# Patient Record
Sex: Male | Born: 1965 | Race: White | Hispanic: No | Marital: Married | Smoking: Current every day smoker
Health system: Southern US, Community
[De-identification: ages and names within clinical notes are randomized; demographics above are authoritative.]

## PROBLEM LIST (undated history)

## (undated) DIAGNOSIS — F319 Bipolar disorder, unspecified: Secondary | ICD-10-CM

## (undated) DIAGNOSIS — IMO0002 Reserved for concepts with insufficient information to code with codable children: Secondary | ICD-10-CM

## (undated) DIAGNOSIS — C7931 Secondary malignant neoplasm of brain: Secondary | ICD-10-CM

## (undated) DIAGNOSIS — C3490 Malignant neoplasm of unspecified part of unspecified bronchus or lung: Secondary | ICD-10-CM

## (undated) DIAGNOSIS — IMO0001 Reserved for inherently not codable concepts without codable children: Secondary | ICD-10-CM

## (undated) DIAGNOSIS — I10 Essential (primary) hypertension: Secondary | ICD-10-CM

## (undated) DIAGNOSIS — E119 Type 2 diabetes mellitus without complications: Secondary | ICD-10-CM

## (undated) DIAGNOSIS — J449 Chronic obstructive pulmonary disease, unspecified: Secondary | ICD-10-CM

## (undated) HISTORY — DX: Chronic obstructive pulmonary disease, unspecified: J44.9

## (undated) HISTORY — DX: Essential (primary) hypertension: I10

## (undated) HISTORY — DX: Type 2 diabetes mellitus without complications: E11.9

## (undated) HISTORY — DX: Malignant neoplasm of unspecified part of unspecified bronchus or lung: C34.90

## (undated) HISTORY — DX: Bipolar disorder, unspecified: F31.9

---

## 1999-09-16 ENCOUNTER — Emergency Department (HOSPITAL_COMMUNITY): Admission: EM | Admit: 1999-09-16 | Discharge: 1999-09-16 | Payer: Self-pay | Admitting: Emergency Medicine

## 1999-09-16 ENCOUNTER — Encounter: Payer: Self-pay | Admitting: Emergency Medicine

## 2000-10-03 ENCOUNTER — Encounter: Payer: Self-pay | Admitting: Emergency Medicine

## 2000-10-03 ENCOUNTER — Emergency Department (HOSPITAL_COMMUNITY): Admission: EM | Admit: 2000-10-03 | Discharge: 2000-10-03 | Payer: Self-pay | Admitting: Emergency Medicine

## 2002-04-06 ENCOUNTER — Emergency Department (HOSPITAL_COMMUNITY): Admission: EM | Admit: 2002-04-06 | Discharge: 2002-04-06 | Payer: Self-pay | Admitting: Emergency Medicine

## 2015-04-22 ENCOUNTER — Telehealth: Payer: Self-pay | Admitting: Internal Medicine

## 2015-04-22 NOTE — Telephone Encounter (Signed)
LEFT MESSAGE FOR PATIENT TO RETURN CALL TO SCHEDULE NP APPT.  °

## 2015-05-10 ENCOUNTER — Other Ambulatory Visit: Payer: Self-pay | Admitting: Internal Medicine

## 2015-05-11 ENCOUNTER — Encounter: Payer: Self-pay | Admitting: Internal Medicine

## 2015-05-11 ENCOUNTER — Telehealth: Payer: Self-pay | Admitting: Internal Medicine

## 2015-05-11 ENCOUNTER — Other Ambulatory Visit: Payer: Medicaid Other

## 2015-05-11 ENCOUNTER — Ambulatory Visit (HOSPITAL_BASED_OUTPATIENT_CLINIC_OR_DEPARTMENT_OTHER): Payer: Medicaid Other

## 2015-05-11 ENCOUNTER — Ambulatory Visit (HOSPITAL_BASED_OUTPATIENT_CLINIC_OR_DEPARTMENT_OTHER): Payer: Medicaid Other | Admitting: Internal Medicine

## 2015-05-11 ENCOUNTER — Ambulatory Visit: Payer: Medicaid Other

## 2015-05-11 VITALS — BP 121/75 | HR 84 | Temp 98.4°F | Resp 18 | Ht 73.0 in | Wt 215.9 lb

## 2015-05-11 DIAGNOSIS — F17210 Nicotine dependence, cigarettes, uncomplicated: Secondary | ICD-10-CM | POA: Diagnosis not present

## 2015-05-11 DIAGNOSIS — C3491 Malignant neoplasm of unspecified part of right bronchus or lung: Secondary | ICD-10-CM

## 2015-05-11 DIAGNOSIS — F1021 Alcohol dependence, in remission: Secondary | ICD-10-CM

## 2015-05-11 DIAGNOSIS — F1290 Cannabis use, unspecified, uncomplicated: Secondary | ICD-10-CM | POA: Diagnosis not present

## 2015-05-11 DIAGNOSIS — Z95828 Presence of other vascular implants and grafts: Secondary | ICD-10-CM

## 2015-05-11 LAB — COMPREHENSIVE METABOLIC PANEL (CC13)
ALK PHOS: 77 U/L (ref 40–150)
AST: 9 U/L (ref 5–34)
Albumin: 3.8 g/dL (ref 3.5–5.0)
Anion Gap: 12 mEq/L — ABNORMAL HIGH (ref 3–11)
BILIRUBIN TOTAL: 0.31 mg/dL (ref 0.20–1.20)
BUN: 11.3 mg/dL (ref 7.0–26.0)
CO2: 23 mEq/L (ref 22–29)
CREATININE: 1.4 mg/dL — AB (ref 0.7–1.3)
Calcium: 9.7 mg/dL (ref 8.4–10.4)
Chloride: 105 mEq/L (ref 98–109)
EGFR: 57 mL/min/{1.73_m2} — AB (ref >90)
Glucose: 88 mg/dl (ref 70–140)
Potassium: 3.5 mEq/L (ref 3.5–5.1)
SODIUM: 140 meq/L (ref 136–145)
TOTAL PROTEIN: 7 g/dL (ref 6.4–8.3)

## 2015-05-11 LAB — CBC WITH DIFFERENTIAL/PLATELET
BASO%: 0.7 % (ref 0.0–2.0)
BASOS ABS: 0 10*3/uL (ref 0.0–0.1)
EOS%: 6.9 % (ref 0.0–7.0)
Eosinophils Absolute: 0.4 10*3/uL (ref 0.0–0.5)
HCT: 32.9 % — ABNORMAL LOW (ref 38.4–49.9)
HGB: 11.3 g/dL — ABNORMAL LOW (ref 13.0–17.1)
LYMPH#: 1.2 10*3/uL (ref 0.9–3.3)
LYMPH%: 22 % (ref 14.0–49.0)
MCH: 33.3 pg (ref 27.2–33.4)
MCHC: 34.3 g/dL (ref 32.0–36.0)
MCV: 97.1 fL (ref 79.3–98.0)
MONO#: 0.4 10*3/uL (ref 0.1–0.9)
MONO%: 7.8 % (ref 0.0–14.0)
NEUT#: 3.4 10*3/uL (ref 1.5–6.5)
NEUT%: 62.6 % (ref 39.0–75.0)
Platelets: 151 10*3/uL (ref 140–400)
RBC: 3.39 10*6/uL — ABNORMAL LOW (ref 4.20–5.82)
RDW: 14 % (ref 11.0–14.6)
WBC: 5.5 10*3/uL (ref 4.0–10.3)
nRBC: 0 % (ref 0–0)

## 2015-05-11 MED ORDER — SODIUM CHLORIDE 0.9 % IJ SOLN
10.0000 mL | INTRAMUSCULAR | Status: DC | PRN
  Administered 2015-05-11: 10 mL via INTRAVENOUS
  Filled 2015-05-11: qty 10

## 2015-05-11 MED ORDER — HEPARIN SOD (PORK) LOCK FLUSH 100 UNIT/ML IV SOLN
500.0000 [IU] | Freq: Once | INTRAVENOUS | Status: AC
  Administered 2015-05-11: 500 [IU] via INTRAVENOUS
  Filled 2015-05-11: qty 5

## 2015-05-11 NOTE — Progress Notes (Signed)
Per Ct scan in January 2016 from [facility] tip of port a cath lies in superior vena cava.

## 2015-05-11 NOTE — Telephone Encounter (Signed)
per pof to sch pt appt-gave pt copy of avs °

## 2015-05-11 NOTE — Telephone Encounter (Signed)
per po fto sch pt appt-gave pt copy of avs-sent back to lab °

## 2015-05-11 NOTE — Progress Notes (Signed)
[place] [phone]   [phone]  CONSULT NOTE  REFERRING PHYSICIAN: Dr. Daphene Jaeger  REASON FOR CONSULTATION:  49 years old white male with history of lung cancer.  HPI Glen Mendez is a 49 y.o. male with past medical history significant for bipolar disorder, hypertension, diabetes mellitus, alcoholic cardiomyopathy, irregular heart rate, COPD as well as long history of smoking. The patient mentions that in October 2015 he presented to the emergency department at [facility] complaining of increasing dyspnea and chest x-ray at that time was concerning for pneumonia. The patient was admitted for intravenous antibiotics treatment. He was also found to have right-sided pleural effusion and underwent thoracentesis that showed no evidence of malignancy. CT scan of the chest at that time showed mass in the right upper lobe. The patient was transferred to the [facility] in [place] for consideration of surgical resection. A PET scan performed at that time showed the right upper lobe lung lesion in addition to right paratracheal nodes as well as subcarinal lymphadenopathy. MRI of the brain at that time was negative for metastatic disease. On 08/15/2014 the patient underwent bronchoscopy and the final pathology case number [id number] showed small cell undifferentiated carcinoma. The patient was seen by Dr. Daphene Jaeger. He underwent a course of concurrent chemoradiation with cisplatin and etoposide for total of 4 cycles completed 11/11/2014. The patient also underwent a prophylactic cranial irradiation completed on 01/03/2015. He tolerated his previous treatment fairly well with no significant adverse effects. His last imaging studies including CT scan of the chest, abdomen and pelvis performed on 03/14/2015 showed extensive pleural/parenchymal abnormality involving the right lung without significant change from the last  scan after completion of his treatment. The patient moved to [facility] to take care of his elderly parents.  Dr. Andreas Ohm kindly referred the patient to me today for evaluation and to establish care with me. When seen today the patient is feeling fine and denied having any significant complaints. He denied having any significant chest pain but continues to have mild shortness of breath with exertion as well as mild cough with no sputum or hemoptysis. He lost few pounds recently secondary to lack of appetite. He also has occasional headache in the morning. The patient denied having any significant nausea or vomiting. Family history: The patient is adopted. The patient is married and has one daughter age 25. He worked in several jobs but currently unemployed. He has a history of smoking up to 2 packs per day for around 35 years and unfortunately he continues to smoke around 5 cigarettes every day. He is also recovering alcoholic. He smokes marijuana occasionally.   HPI  Past Medical History  Diagnosis Date  . Lung cancer   . Bipolar disorder   . COPD (chronic obstructive pulmonary disease)   . Hypertension   . Diabetes mellitus without complication     History reviewed. No pertinent past surgical history.  Family History  Problem Relation Age of Onset  . Adopted: Yes    Social History History  Substance Use Topics  . Smoking status: Current Every Day Smoker -- 2.00 packs/day for 20 years    Types: Cigarettes  . Smokeless tobacco: Never Used     Comment: Currently smokes 5 cig. day  . Alcohol Use: No     Comment: recovering alcholic quit 2952,     Allergies  Allergen Reactions  . Vicodin [Hydrocodone-Acetaminophen]     " makes  me itch" i can take it if I take bendryl    Current Outpatient Prescriptions  Medication Sig Dispense Refill  . amitriptyline (ELAVIL) 25 MG tablet Take 25 mg by mouth at bedtime.    . carvedilol (COREG) 25 MG tablet Take 25 mg by mouth 2  (two) times daily with a meal.    . clonazePAM (KLONOPIN) 1 MG tablet Take 1 mg by mouth 3 (three) times daily as needed for anxiety.    Marland Kitchen lisinopril (PRINIVIL,ZESTRIL) 10 MG tablet Take 10 mg by mouth daily.    . metFORMIN (GLUCOPHAGE) 500 MG tablet Take 500 mg by mouth 2 (two) times daily with a meal.    . QUEtiapine (SEROQUEL) 400 MG tablet Take 400 mg by mouth at bedtime.    . ranitidine (ZANTAC) 150 MG tablet Take 150 mg by mouth 2 (two) times daily.     No current facility-administered medications for this visit.   Facility-Administered Medications Ordered in Other Visits  Medication Dose Route Frequency Provider Last Rate Last Dose  . sodium chloride 0.9 % injection 10 mL  10 mL Intravenous PRN Curt Bears, MD   10 mL at 05/11/15 1612    Review of Systems  Constitutional: positive for anorexia and weight loss Eyes: negative Ears, nose, mouth, throat, and face: negative Respiratory: positive for cough and dyspnea on exertion Cardiovascular: negative Gastrointestinal: negative Genitourinary:negative Integument/breast: negative Hematologic/lymphatic: negative Musculoskeletal:negative Neurological: negative Behavioral/Psych: negative Endocrine: negative Allergic/Immunologic: negative  Physical Exam  SNK:NLZJQ, healthy, no distress, well nourished and well developed SKIN: skin color, texture, turgor are normal, no rashes or significant lesions HEAD: Normocephalic, No masses, lesions, tenderness or abnormalities EYES: normal, PERRLA, Conjunctiva are pink and non-injected EARS: External ears normal, Canals clear OROPHARYNX:no exudate, no erythema and lips, buccal mucosa, and tongue normal  NECK: supple, no adenopathy, no JVD LYMPH:  no palpable lymphadenopathy, no hepatosplenomegaly LUNGS: clear to auscultation , and palpation HEART: regular rate & rhythm and no murmurs ABDOMEN:abdomen soft, non-tender, normal bowel sounds and no masses or organomegaly BACK: Back  symmetric, no curvature., No CVA tenderness EXTREMITIES:no joint deformities, effusion, or inflammation, no edema, no skin discoloration, no clubbing  NEURO: alert & oriented x 3 with fluent speech, no focal motor/sensory deficits  PERFORMANCE STATUS: ECOG 1  LABORATORY DATA: Lab Results  Component Value Date   WBC 5.5 05/11/2015   HGB 11.3* 05/11/2015   HCT 32.9* 05/11/2015   MCV 97.1 05/11/2015   PLT 151 05/11/2015      Chemistry      Component Value Date/Time   NA 140 05/11/2015 1609   K 3.5 05/11/2015 1609   CO2 23 05/11/2015 1609   BUN 11.3 05/11/2015 1609   CREATININE 1.4* 05/11/2015 1609      Component Value Date/Time   CALCIUM 9.7 05/11/2015 1609   ALKPHOS 77 05/11/2015 1609   AST 9 05/11/2015 1609   ALT <6 05/11/2015 1609   BILITOT 0.31 05/11/2015 1609       RADIOGRAPHIC STUDIES: No results found.  ASSESSMENT: This is a very pleasant 49 years old white male who was diagnosed with limited stage (T2a, N2, M0) small cell lung cancer in October 2015 status post course of concurrent chemoradiation with 4 cycles of cisplatin and etoposide followed by prophylactic cranial irradiation with significant improvement in his disease.   PLAN: I had a lengthy discussion with the patient today about his current disease status and treatment options. The patient completed his treatment under the care of Dr. Andreas Ohm  at [facility] oncology with good response.  He is currently on observation.  I recommended for the patient to have repeat CT scan of the chest performed next week as a baseline. If no clear evidence for disease progression, I would see the patient back for follow-up visit in 3 months with repeat CT scan of the chest. The patient understand the risk of disease recurrence for small cell lung cancer and he will need to be monitored closely. For smoke cessation, I strongly encouraged the patient to quit smoking and offered him a smoke cessation program. He was advised to  call immediately if he has any concerning symptoms in the interval.  The patient voices understanding of current disease status and treatment options and is in agreement with the current care plan.  All questions were answered. The patient knows to call the clinic with any problems, questions or concerns. We can certainly see the patient much sooner if necessary.  Thank you so much for allowing me to participate in the care of Glen Mendez. I will continue to follow up the patient with you and assist in his care.  I spent 40 minutes counseling the patient face to face. The total time spent in the appointment was 60 minutes.  Disclaimer: This note was dictated with voice recognition software. Similar sounding words can inadvertently be transcribed and may not be corrected upon review.   Nessie Nong K. May 11, 2015, 5:46 PM

## 2015-05-11 NOTE — Patient Instructions (Signed)

## 2015-05-24 ENCOUNTER — Ambulatory Visit (HOSPITAL_COMMUNITY): Admission: RE | Admit: 2015-05-24 | Payer: Medicaid Other | Source: Ambulatory Visit

## 2015-06-08 ENCOUNTER — Ambulatory Visit (HOSPITAL_COMMUNITY): Payer: Medicaid Other

## 2015-06-09 ENCOUNTER — Telehealth: Payer: Self-pay | Admitting: Medical Oncology

## 2015-06-09 NOTE — Telephone Encounter (Signed)
I left message that pt has CT scan on aug 8th

## 2015-06-13 ENCOUNTER — Ambulatory Visit (HOSPITAL_COMMUNITY): Admission: RE | Admit: 2015-06-13 | Payer: Medicaid Other | Source: Ambulatory Visit

## 2015-07-17 ENCOUNTER — Emergency Department (HOSPITAL_COMMUNITY): Payer: Medicaid Other

## 2015-07-17 ENCOUNTER — Inpatient Hospital Stay (HOSPITAL_COMMUNITY)
Admission: EM | Admit: 2015-07-17 | Discharge: 2015-07-18 | DRG: 054 | Disposition: A | Discharge status: Home / Self Care | Payer: Medicaid Other | Attending: Internal Medicine | Admitting: Internal Medicine

## 2015-07-17 ENCOUNTER — Encounter (HOSPITAL_COMMUNITY): Payer: Self-pay | Admitting: *Deleted

## 2015-07-17 DIAGNOSIS — C7931 Secondary malignant neoplasm of brain: Principal | ICD-10-CM | POA: Diagnosis present

## 2015-07-17 DIAGNOSIS — E1122 Type 2 diabetes mellitus with diabetic chronic kidney disease: Secondary | ICD-10-CM | POA: Diagnosis present

## 2015-07-17 DIAGNOSIS — Z923 Personal history of irradiation: Secondary | ICD-10-CM

## 2015-07-17 DIAGNOSIS — Z66 Do not resuscitate: Secondary | ICD-10-CM | POA: Diagnosis present

## 2015-07-17 DIAGNOSIS — Z885 Allergy status to narcotic agent status: Secondary | ICD-10-CM

## 2015-07-17 DIAGNOSIS — I9589 Other hypotension: Secondary | ICD-10-CM | POA: Diagnosis not present

## 2015-07-17 DIAGNOSIS — Z9221 Personal history of antineoplastic chemotherapy: Secondary | ICD-10-CM | POA: Diagnosis not present

## 2015-07-17 DIAGNOSIS — J449 Chronic obstructive pulmonary disease, unspecified: Secondary | ICD-10-CM | POA: Diagnosis present

## 2015-07-17 DIAGNOSIS — R4182 Altered mental status, unspecified: Secondary | ICD-10-CM

## 2015-07-17 DIAGNOSIS — G936 Cerebral edema: Secondary | ICD-10-CM | POA: Diagnosis present

## 2015-07-17 DIAGNOSIS — N183 Chronic kidney disease, stage 3 (moderate): Secondary | ICD-10-CM | POA: Diagnosis present

## 2015-07-17 DIAGNOSIS — G939 Disorder of brain, unspecified: Secondary | ICD-10-CM

## 2015-07-17 DIAGNOSIS — C3491 Malignant neoplasm of unspecified part of right bronchus or lung: Secondary | ICD-10-CM

## 2015-07-17 DIAGNOSIS — E119 Type 2 diabetes mellitus without complications: Secondary | ICD-10-CM

## 2015-07-17 DIAGNOSIS — I129 Hypertensive chronic kidney disease with stage 1 through stage 4 chronic kidney disease, or unspecified chronic kidney disease: Secondary | ICD-10-CM | POA: Diagnosis present

## 2015-07-17 DIAGNOSIS — F17210 Nicotine dependence, cigarettes, uncomplicated: Secondary | ICD-10-CM | POA: Diagnosis present

## 2015-07-17 DIAGNOSIS — Z79891 Long term (current) use of opiate analgesic: Secondary | ICD-10-CM

## 2015-07-17 DIAGNOSIS — Z79899 Other long term (current) drug therapy: Secondary | ICD-10-CM | POA: Diagnosis not present

## 2015-07-17 DIAGNOSIS — I10 Essential (primary) hypertension: Secondary | ICD-10-CM | POA: Diagnosis not present

## 2015-07-17 DIAGNOSIS — C3490 Malignant neoplasm of unspecified part of unspecified bronchus or lung: Secondary | ICD-10-CM | POA: Diagnosis present

## 2015-07-17 DIAGNOSIS — F319 Bipolar disorder, unspecified: Secondary | ICD-10-CM | POA: Diagnosis present

## 2015-07-17 LAB — COMPREHENSIVE METABOLIC PANEL
ALBUMIN: 3.5 g/dL (ref 3.5–5.0)
ALK PHOS: 63 U/L (ref 38–126)
ALT: 9 U/L — ABNORMAL LOW (ref 17–63)
ANION GAP: 7 (ref 5–15)
AST: 13 U/L — ABNORMAL LOW (ref 15–41)
BUN: 14 mg/dL (ref 6–20)
CHLORIDE: 110 mmol/L (ref 101–111)
CO2: 25 mmol/L (ref 22–32)
Calcium: 9 mg/dL (ref 8.9–10.3)
Creatinine, Ser: 1.53 mg/dL — ABNORMAL HIGH (ref 0.61–1.24)
GFR calc non Af Amer: 53 mL/min — ABNORMAL LOW (ref >60)
GLUCOSE: 88 mg/dL (ref 65–99)
POTASSIUM: 3.5 mmol/L (ref 3.5–5.1)
SODIUM: 142 mmol/L (ref 135–145)
Total Bilirubin: 0.5 mg/dL (ref 0.3–1.2)
Total Protein: 6.5 g/dL (ref 6.5–8.1)

## 2015-07-17 LAB — GLUCOSE, CAPILLARY
Glucose-Capillary: 78 mg/dL (ref 65–99)
Glucose-Capillary: 136 mg/dL — ABNORMAL HIGH (ref 65–99)

## 2015-07-17 LAB — CBC WITH DIFFERENTIAL/PLATELET
BASOS PCT: 1 % (ref 0–1)
Basophils Absolute: 0 10*3/uL (ref 0.0–0.1)
EOS ABS: 0.2 10*3/uL (ref 0.0–0.7)
EOS PCT: 3 % (ref 0–5)
HCT: 33.3 % — ABNORMAL LOW (ref 39.0–52.0)
HEMOGLOBIN: 11.3 g/dL — AB (ref 13.0–17.0)
Lymphocytes Relative: 19 % (ref 12–46)
Lymphs Abs: 1.1 10*3/uL (ref 0.7–4.0)
MCH: 33.1 pg (ref 26.0–34.0)
MCHC: 33.9 g/dL (ref 30.0–36.0)
MCV: 97.7 fL (ref 78.0–100.0)
MONOS PCT: 8 % (ref 3–12)
Monocytes Absolute: 0.5 10*3/uL (ref 0.1–1.0)
NEUTROS PCT: 69 % (ref 43–77)
Neutro Abs: 4.1 10*3/uL (ref 1.7–7.7)
PLATELETS: 137 10*3/uL — AB (ref 150–400)
RBC: 3.41 MIL/uL — ABNORMAL LOW (ref 4.22–5.81)
RDW: 14.2 % (ref 11.5–15.5)
WBC: 5.9 10*3/uL (ref 4.0–10.5)

## 2015-07-17 LAB — URINALYSIS, ROUTINE W REFLEX MICROSCOPIC
BILIRUBIN URINE: NEGATIVE
Glucose, UA: NEGATIVE mg/dL
Hgb urine dipstick: NEGATIVE
Ketones, ur: NEGATIVE mg/dL
LEUKOCYTES UA: NEGATIVE
NITRITE: NEGATIVE
PH: 7 (ref 5.0–8.0)
Protein, ur: NEGATIVE mg/dL
SPECIFIC GRAVITY, URINE: 1.01 (ref 1.005–1.030)
UROBILINOGEN UA: 0.2 mg/dL (ref 0.0–1.0)

## 2015-07-17 LAB — I-STAT TROPONIN, ED: TROPONIN I, POC: 0 ng/mL (ref 0.00–0.08)

## 2015-07-17 LAB — I-STAT CG4 LACTIC ACID, ED: LACTIC ACID, VENOUS: 0.48 mmol/L — AB (ref 0.5–2.0)

## 2015-07-17 LAB — CBG MONITORING, ED: GLUCOSE-CAPILLARY: 98 mg/dL (ref 65–99)

## 2015-07-17 MED ORDER — DEXAMETHASONE 4 MG PO TABS
4.0000 mg | ORAL_TABLET | Freq: Four times a day (QID) | ORAL | Status: DC
  Administered 2015-07-17 – 2015-07-18 (×4): 4 mg via ORAL
  Filled 2015-07-17 (×8): qty 1

## 2015-07-17 MED ORDER — NICOTINE 21 MG/24HR TD PT24
21.0000 mg | MEDICATED_PATCH | Freq: Every day | TRANSDERMAL | Status: DC
  Administered 2015-07-18: 21 mg via TRANSDERMAL
  Filled 2015-07-17 (×2): qty 1

## 2015-07-17 MED ORDER — ACETAMINOPHEN 325 MG PO TABS: 650.0000 mg | ORAL_TABLET | Freq: Four times a day (QID) | ORAL | Status: DC | PRN

## 2015-07-17 MED ORDER — AMITRIPTYLINE HCL 25 MG PO TABS
25.0000 mg | ORAL_TABLET | Freq: Every day | ORAL | Status: DC
  Administered 2015-07-17: 25 mg via ORAL
  Filled 2015-07-17 (×2): qty 1

## 2015-07-17 MED ORDER — DEXAMETHASONE SODIUM PHOSPHATE 4 MG/ML IJ SOLN
4.0000 mg | Freq: Four times a day (QID) | INTRAMUSCULAR | Status: DC
  Filled 2015-07-17 (×3): qty 1

## 2015-07-17 MED ORDER — QUETIAPINE FUMARATE 400 MG PO TABS
400.0000 mg | ORAL_TABLET | Freq: Every day | ORAL | Status: DC
  Administered 2015-07-17: 400 mg via ORAL
  Filled 2015-07-17 (×2): qty 1

## 2015-07-17 MED ORDER — DEXAMETHASONE SODIUM PHOSPHATE 4 MG/ML IJ SOLN
4.0000 mg | Freq: Four times a day (QID) | INTRAMUSCULAR | Status: DC
  Filled 2015-07-17 (×7): qty 1

## 2015-07-17 MED ORDER — ALUM & MAG HYDROXIDE-SIMETH 200-200-20 MG/5ML PO SUSP: 30.0000 mL | Freq: Four times a day (QID) | ORAL | Status: DC | PRN

## 2015-07-17 MED ORDER — SODIUM CHLORIDE 0.9 % IV BOLUS (SEPSIS)
1000.0000 mL | Freq: Once | INTRAVENOUS | Status: AC
  Administered 2015-07-17: 1000 mL via INTRAVENOUS

## 2015-07-17 MED ORDER — INSULIN ASPART 100 UNIT/ML ~~LOC~~ SOLN: 0.0000 [IU] | Freq: Three times a day (TID) | SUBCUTANEOUS | Status: DC

## 2015-07-17 MED ORDER — ONDANSETRON HCL 4 MG PO TABS: 4.0000 mg | ORAL_TABLET | Freq: Four times a day (QID) | ORAL | Status: DC | PRN

## 2015-07-17 MED ORDER — CLONAZEPAM 0.5 MG PO TABS
0.5000 mg | ORAL_TABLET | Freq: Three times a day (TID) | ORAL | Status: DC
  Administered 2015-07-17 – 2015-07-18 (×2): 0.5 mg via ORAL
  Filled 2015-07-17 (×2): qty 1

## 2015-07-17 MED ORDER — TRAZODONE HCL 100 MG PO TABS
100.0000 mg | ORAL_TABLET | Freq: Once | ORAL | Status: AC
  Administered 2015-07-17: 100 mg via ORAL
  Filled 2015-07-17: qty 1
  Filled 2015-07-17: qty 2

## 2015-07-17 MED ORDER — SODIUM CHLORIDE 0.9 % IJ SOLN: 3.0000 mL | INTRAMUSCULAR | Status: DC | PRN

## 2015-07-17 MED ORDER — DEXAMETHASONE 4 MG PO TABS
4.0000 mg | ORAL_TABLET | Freq: Four times a day (QID) | ORAL | Status: DC
  Filled 2015-07-17 (×3): qty 1

## 2015-07-17 MED ORDER — ONDANSETRON HCL 4 MG/2ML IJ SOLN: 4.0000 mg | Freq: Four times a day (QID) | INTRAMUSCULAR | Status: DC | PRN

## 2015-07-17 MED ORDER — FAMOTIDINE 20 MG PO TABS
20.0000 mg | ORAL_TABLET | Freq: Two times a day (BID) | ORAL | Status: DC
  Administered 2015-07-17 – 2015-07-18 (×2): 20 mg via ORAL
  Filled 2015-07-17 (×3): qty 1

## 2015-07-17 MED ORDER — OXYCODONE HCL 5 MG PO TABS: 5.0000 mg | ORAL_TABLET | Freq: Four times a day (QID) | ORAL | Status: DC | PRN

## 2015-07-17 MED ORDER — ACETAMINOPHEN 650 MG RE SUPP: 650.0000 mg | Freq: Four times a day (QID) | RECTAL | Status: DC | PRN

## 2015-07-17 MED ORDER — IPRATROPIUM-ALBUTEROL 0.5-2.5 (3) MG/3ML IN SOLN: 3.0000 mL | Freq: Four times a day (QID) | RESPIRATORY_TRACT | Status: DC | PRN

## 2015-07-17 MED ORDER — SODIUM CHLORIDE 0.9 % IJ SOLN
3.0000 mL | Freq: Two times a day (BID) | INTRAMUSCULAR | Status: DC
  Administered 2015-07-18: 3 mL via INTRAVENOUS

## 2015-07-17 MED ORDER — INSULIN ASPART 100 UNIT/ML ~~LOC~~ SOLN: 0.0000 [IU] | Freq: Every day | SUBCUTANEOUS | Status: DC

## 2015-07-17 MED ORDER — SODIUM CHLORIDE 0.9 % IJ SOLN
10.0000 mL | INTRAMUSCULAR | Status: DC | PRN
  Administered 2015-07-17: 10 mL

## 2015-07-17 MED ORDER — SODIUM CHLORIDE 0.9 % IV SOLN: 250.0000 mL | INTRAVENOUS | Status: DC | PRN

## 2015-07-17 NOTE — ED Notes (Signed)
Stage 4 Lung Cancer - generalized weakness x1 week.   Altered mental status

## 2015-07-17 NOTE — ED Notes (Addendum)
Family at bedside.  IV pulled out by patient; however, he does have portacath.  Corliss Skains, RN to access portacath for bloodwork and iv fluids.

## 2015-07-17 NOTE — ED Notes (Signed)
Pt is aware of the need for urine, urinal at bedside  

## 2015-07-17 NOTE — ED Notes (Signed)
Bed: WA09 Expected date: 07/17/15 Expected time: 1:02 PM Means of arrival: Ambulance Comments: CA pt confusion, hypotension

## 2015-07-17 NOTE — ED Notes (Signed)
NURSE INFO ME SHE WILL TRY TO GET BLOOD IF NOT WILL GET IT FROM PORT

## 2015-07-17 NOTE — ED Notes (Signed)
Attempted to call report.  RN will call back in 5 minutes.

## 2015-07-17 NOTE — ED Notes (Signed)
Report called to [place], [place] reviewed, all questions answered

## 2015-07-17 NOTE — H&P (Addendum)
[facility] History and Physical  Glen Mendez FKC:[id number] DOB: [birth year] DOA: 07/17/2015   PCP: No primary care provider on file.    Chief Complaint: Headaches unsteady gait  HPI: Glen Mendez is a 49 y.o. male  With h/o small cell lung CA status post chemoradiation, smoker with COPD, bipolar disorder, hypertension and diabetes on oral medication He presents to the hospital for headaches, slurred speech, repetitive speech, drowsiness, disoriention and stumbling gate as explained by his wife. She states that she has noticed this for the past couple of days. He is not able to give me a good history and is essentially asking to be released to go home. According to the wife he is been stumbling but has not fallen. He has been confused and having trouble with speech as mentioned above. He has been taking indomethacin for headaches which is not helping. His wife states that he also had a syncopal episode today. He was laying in bed at that time. There was no shaking tongue biting or loss of control of bowel or bladder. The episode lasted for 5 minutes and he was quite oriented when he awoke.  ROS mostly answered by wife General: + anorexia, weight loss about 60 lbs in 1 yr- no fevers Cardiac: Denies chest pain,  palpitations, pedal edema  Respiratory: + cough no sputum, more shortness of breath on exertion recently, occasional wheezing GI: no indigestion/heartburn, abdominal pain, nausea, vomiting, diarrhea and constipation-  GU: Denies hematuria, incontinence, dysuria  Musculoskeletal: generalized joint and back pain Skin: Denies suspicious skin lesions Neurologic: Denies focal weakness or change in vision- numbness and tingling in fingertips and toes Psychiatry: Denies depression or anxiety under control with medications Hematologic: no easy bruising or bleeding  All other systems reviewed and found to be negative.  Past Medical History  Diagnosis Date  . Lung cancer   . Bipolar  disorder   . COPD (chronic obstructive pulmonary disease)   . Hypertension   . Diabetes mellitus without complication     surgical history- port placement  Social History: smoking 1/2 - 1 PPD, no alcohol use, no drug [place] Lives at home with wife   Allergies  Allergen Reactions  . Vicodin [Hydrocodone-Acetaminophen]     " makes me itch" i can take it if I take bendryl    Family history:   Family History  Problem Relation Age of Onset  . Adopted: Yes      Prior to Admission medications   Medication Sig Start Date End Date Taking? Authorizing Provider  amitriptyline (ELAVIL) 25 MG tablet Take 25 mg by mouth at bedtime.   Yes Historical Provider, MD  carvedilol (COREG) 25 MG tablet Take 25 mg by mouth 2 (two) times daily with a meal.   Yes Historical Provider, MD  clonazePAM (KLONOPIN) 1 MG tablet Take 1 mg by mouth 3 (three) times daily as needed for anxiety.   Yes Historical Provider, MD  indomethacin (INDOCIN SR) 75 MG CR capsule Take 75 mg by mouth as needed for mild pain (neurologist headaches/ DR Riki Rusk [place]).   Yes Historical Provider, MD  lisinopril (PRINIVIL,ZESTRIL) 10 MG tablet Take 10 mg by mouth daily.   Yes Historical Provider, MD  LORazepam (ATIVAN) 1 MG tablet Take 1 mg by mouth every 4 (four) hours as needed (nausea).   Yes Historical Provider, MD  metFORMIN (GLUCOPHAGE) 500 MG tablet Take 500 mg by mouth 2 (two) times daily with a meal.   Yes Historical Provider, MD  QUEtiapine (SEROQUEL) 400 MG tablet Take 400 mg by mouth at bedtime.   Yes Historical Provider, MD  ranitidine (ZANTAC) 150 MG tablet Take 150 mg by mouth 2 (two) times daily.   Yes Historical Provider, MD  traMADol (ULTRAM) 50 MG tablet Take 50 mg by mouth every 6 (six) hours as needed for moderate pain.   Yes Historical Provider, MD     Physical Exam: Filed Vitals:   07/17/15 1343 07/17/15 1554  BP: 93/61 115/69  Pulse: 96 87  Temp: 98.5 F (36.9 C)   Resp: 22 22  SpO2: 100% 96%      General: Middle-aged male sitting up in bed in no acute distress HEENT: Normocephalic and Atraumatic, Mucous membranes pink                PERRLA; EOM intact; No scleral icterus,                 Nares: Patent, Oropharynx: Clear, Fair Dentition                 Neck: FROM, no cervical lymphadenopathy, thyromegaly, carotid bruit or JVD;  Breasts: deferred CHEST WALL: No tenderness  CHEST: He has a cough, Normal respiration, lungs clear to auscultation bilaterally  HEART: Regular rate and rhythm; no murmurs rubs or gallops  BACK: No kyphosis or scoliosis; no CVA tenderness  GI: Positive Bowel Sounds, soft, non-tender; no masses, no organomegaly Rectal Exam: deferred MSK: No cyanosis, clubbing, or edema Genitalia: not examined  SKIN:  no rash or ulceration  CNS: Appears a bit drowsy, Oriented x 4, Nonfocal exam, CN 2-12 intact- I'm unable to tell that his speech is slurred  Labs on Admission:  Basic Metabolic Panel:  Recent Labs Lab 07/17/15 1524  NA 142  K 3.5  CL 110  CO2 25  GLUCOSE 88  BUN 14  CREATININE 1.53*  CALCIUM 9.0   Liver Function Tests:  Recent Labs Lab 07/17/15 1524  AST 13*  ALT 9*  ALKPHOS 63  BILITOT 0.5  PROT 6.5  ALBUMIN 3.5   No results for input(s): LIPASE, AMYLASE in the last 168 hours. No results for input(s): AMMONIA in the last 168 hours. CBC:  Recent Labs Lab 07/17/15 1524  WBC 5.9  NEUTROABS 4.1  HGB 11.3*  HCT 33.3*  MCV 97.7  PLT 137*   Cardiac Enzymes: No results for input(s): CKTOTAL, CKMB, CKMBINDEX, TROPONINI in the last 168 hours.  BNP (last 3 results) No results for input(s): BNP in the last 8760 hours.  ProBNP (last 3 results) No results for input(s): PROBNP in the last 8760 hours.  CBG:  Recent Labs Lab 07/17/15 1345  GLUCAP 98    Radiological Exams on Admission: Dg Chest 2 View  07/17/2015   CLINICAL DATA:  Cough and altered mental status. History of lung cancer and COPD.  EXAM: CHEST  2 VIEW   COMPARISON:  08/06/2014  FINDINGS: Right jugular Port-A-Cath has been placed and terminates over the mid right atrium. Cardiac silhouette is within normal limits for AP technique. There is improved but still extensive opacity throughout the right upper lobe, now with evidence of some volume loss given mild rightward mediastinal shift. Some of this opacity in this region may be pleural soft tissue or loculated fluid. There is evidence of a small pleural effusion at the right lung base. The left lung remains clear. No pneumothorax is seen. No acute osseous abnormality is identified.  IMPRESSION: 1. Mildly improved aeration of the right  upper lobe, however extensive parenchymal and pleural opacity remains with evidence of a degree of volume loss. This presumably relates to patient's history of lung cancer, and comparison with any more recent outside imaging is suggested. 2. Small right pleural effusion at the base. 3. Right jugular Port-A-Cath terminates over the mid right atrium.   Electronically Signed   By: Logan Bores M.D.   On: 07/17/2015 15:32   Ct Head Wo Contrast  07/17/2015   CLINICAL DATA:  Altered mental status. Very lethargic for the last few days. Right-sided weakness. History of stage IV lung cancer.  EXAM: CT HEAD WITHOUT CONTRAST  TECHNIQUE: Contiguous axial images were obtained from the base of the skull through the vertex without intravenous contrast.  COMPARISON:  05/05/2014  FINDINGS: There are multiple new brain lesions most consistent with metastases. A low-density/cystic lesion in the left parietal lobe measures 3.4 cm with mild surrounding vasogenic edema. There is a mildly hyperattenuating lesion in the superior left temporal lobe which measures approximately 2 cm with mild-to-moderate surrounding edema. A small mildly hyperattenuating lesion in the right frontal operculum measure 7 mm with mild edema. There may be an additional 1 cm lesion in the left centrum semiovale anterior to the  larger cystic lesion. There is at most minimal mass effect on the left lateral ventricle without midline shift. No acute large territory infarct, acute intracranial hemorrhage, or extra-axial fluid collection is seen. Incidental note is made of a cavum septum pellucidum et vergae.  Prior bilateral cataract extraction is noted. Visualized paranasal sinuses and mastoid air cells are clear. No suspicious skull lesions are identified.  IMPRESSION: At least 3-4 new brain lesions with mild-to-moderate associated edema, consistent with metastases. No significant mass effect. Recommend further evaluation with brain MRI.   Electronically Signed   By: Logan Bores M.D.   On: 07/17/2015 16:56    EKG: Independently reviewed. NSR at 81 bpm  Assessment/Plan Principal Problem:   Brain metastases-  - patient quite hesitant to stay in the hospital but convinced to stay at lease one night -  history of Small cell lung cancer- T2a, N2, M0 diagnosed in October 2015 - s/p Chemo/radiation which ended in December of last year- has been on observation being monitored by Dr. Earlie Server -give one dose of IV Decadron and start oral Decadron - oncology consult to determine further plan and whether he will need radiation and/or chemotherapy - PT eval - avoid anticoagulation- d/c Tramadol   Active Problems:  Syncope? - occurred while in bed- could have simply fallen asleep -  Question Seizure - doubtful that it is cardiac as he has no cardiac issues - may  need to consider AEDs if recurrent spells despite steroids  Pleural opacity in RUL - possibly from radiation?- Dr Julien Nordmann to adress    DM type 2 (diabetes mellitus, type 2) -Hold metformin-start moderate dose sliding scale insulin a- sugars will be elevated on Decadron   Hypotension with a history of  HTN (hypertension) - Hold Coreg and lisinopril for today -Monitor BP for resumption    Bipolar 1 disorder -Stable on Seroquel and when necessary Klonopin-on 1 mg  of Klonopin 3 times a day however, as he is slightly sleepy at this time, will cut Klonopin back to half a milligram 3 times a day with parameters to hold if he is sleepy-discussed with patient and wife  CKD 3 - stable  COPD/ cough - wife states he uses Atrovent/ albuterol nebs ocassionally -not on med rec-  Will order PRN - monitor cough- currently no sputum  Smoker - Nicotine patch ordered   Consulted: Dr Julien Nordmann placed in [facility] as consultant  Code Status: DNR per patient- witnessed by  Shirline Frees RN Family Communication: wife at bedside  DVT Prophylaxis SCDs  Time spent: 60 min  [place], MD [facility]  If 7PM-7AM, please contact night-coverage [email or web address] 07/17/2015, 5:50 PM

## 2015-07-17 NOTE — ED Provider Notes (Signed)
CSN: [id number]     Arrival date & time 07/17/15  59 History   First MD Initiated Contact with Patient 07/17/15 1354     Chief Complaint  Patient presents with  . Altered Mental Status    HPI   Glen Mendez is a 49 y.o. male with a PMH of small cell lung cancer, HTN, DM, COPD, bipolar disorder who presents to the ED with altered mental status x 2 days. His wife is present at bedside, who reports the patient has been weak, more fatigued, and confused over the past 2 days. She states he answers questions inappropriately and repeats himself frequently. Patient reports non-productive cough, shortness of breath, headache, and chills. Denies chest pain, abdominal pain, nausea, vomiting, diarrhea, constipation, dysuria, urgency, frequency. His wife reports he underwent chemotherapy for his lung cancer, and finished his last session in December.   Past Medical History  Diagnosis Date  . Lung cancer   . Bipolar disorder   . COPD (chronic obstructive pulmonary disease)   . Hypertension   . Diabetes mellitus without complication    History reviewed. No pertinent past surgical history. Family History  Problem Relation Age of Onset  . Adopted: Yes   Social History  Substance Use Topics  . Smoking status: Current Every Day Smoker -- 2.00 packs/day for 20 years    Types: Cigarettes  . Smokeless tobacco: Never Used     Comment: Currently smokes 5 cig. day  . 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 Negative for fever, activity change, appetite change and fatigue.  HENT: Negative for congestion.   Eyes: Negative for visual disturbance.  Respiratory: Positive for cough and shortness of breath.   Cardiovascular: Negative for chest pain, palpitations and leg swelling.  Gastrointestinal: Negative for nausea, vomiting, abdominal pain, diarrhea, constipation and abdominal distention.  Genitourinary: Negative for  dysuria, urgency and frequency.  Musculoskeletal: Negative for myalgias, back pain, arthralgias, neck pain and neck stiffness.  Skin: Negative for color change, pallor, rash and wound.  Neurological: Positive for weakness and headaches. Negative for dizziness, syncope, light-headedness and numbness.  Psychiatric/Behavioral: Positive for confusion.  All other systems reviewed and are negative.     Allergies  Vicodin  Home Medications   Prior to Admission medications   Medication Sig Start Date End Date Taking? Authorizing Provider  amitriptyline (ELAVIL) 25 MG tablet Take 25 mg by mouth at bedtime.    Historical Provider, MD  carvedilol (COREG) 25 MG tablet Take 25 mg by mouth 2 (two) times daily with a meal.    Historical Provider, MD  clonazePAM (KLONOPIN) 1 MG tablet Take 1 mg by mouth 3 (three) times daily as needed for anxiety.    Historical Provider, MD  lisinopril (PRINIVIL,ZESTRIL) 10 MG tablet Take 10 mg by mouth daily.    Historical Provider, MD  metFORMIN (GLUCOPHAGE) 500 MG tablet Take 500 mg by mouth 2 (two) times daily with a meal.    Historical Provider, MD  QUEtiapine (SEROQUEL) 400 MG tablet Take 400 mg by mouth at bedtime.    Historical Provider, MD  ranitidine (ZANTAC) 150 MG tablet Take 150 mg by mouth 2 (two) times daily.    Historical Provider, MD    BP 93/61 mmHg  Pulse 96  Temp(Src) 98.5 F (36.9 C)  Resp 22  SpO2 100% Physical Exam  Constitutional: He appears well-developed and well-nourished. No distress.  HENT:  Head: Normocephalic and atraumatic.  Right Ear: External ear normal.  Left Ear: External ear normal.  Nose: Nose normal.  Mouth/Throat: Uvula is midline, oropharynx is clear and moist and mucous membranes are normal.  Eyes: Conjunctivae, EOM and lids are normal. Pupils are equal, round, and reactive to light. Right eye exhibits no discharge. Left eye exhibits no discharge. No scleral icterus.  Neck: Normal range of motion. Neck supple.   Cardiovascular: Normal rate, regular rhythm, normal heart sounds, intact distal pulses and normal pulses.   Pulmonary/Chest: Effort normal. No respiratory distress. He has no wheezes. He has rhonchi in the right upper field. He has no rales.  Abdominal: Soft. Normal appearance and bowel sounds are normal. He exhibits no distension and no mass. There is no tenderness. There is no rigidity, no rebound and no guarding.  Musculoskeletal: Normal range of motion. He exhibits no edema or tenderness.  Neurological: He is alert. He has normal strength. No cranial nerve deficit or sensory deficit.  Oriented to person and place.  Skin: Skin is warm, dry and intact. No rash noted. He is not diaphoretic. No erythema. No pallor.  Psychiatric: He has a normal mood and affect. His speech is normal and behavior is normal. Thought content normal.  Nursing note and vitals reviewed.   ED Course  Procedures (including critical care time)  Labs Review Labs Reviewed  CBC WITH DIFFERENTIAL/PLATELET - Abnormal; Notable for the following:    RBC 3.41 (*)    Hemoglobin 11.3 (*)    HCT 33.3 (*)    Platelets 137 (*)    All other components within normal limits  COMPREHENSIVE METABOLIC PANEL - Abnormal; Notable for the following:    Creatinine, Ser 1.53 (*)    AST 13 (*)    ALT 9 (*)    GFR calc non Af Amer 53 (*)    All other components within normal limits  I-STAT CG4 LACTIC ACID, ED - Abnormal; Notable for the following:    Lactic Acid, Venous 0.48 (*)    All other components within normal limits  URINALYSIS, ROUTINE W REFLEX MICROSCOPIC (NOT AT [facility])  CBG MONITORING, ED  I-STAT TROPOININ, ED  I-STAT CG4 LACTIC ACID, ED    Imaging Review Dg Chest 2 View  07/17/2015   CLINICAL DATA:  Cough and altered mental status. History of lung cancer and COPD.  EXAM: CHEST  2 VIEW  COMPARISON:  08/06/2014  FINDINGS: Right jugular Port-A-Cath has been placed and terminates over the mid right atrium. Cardiac  silhouette is within normal limits for AP technique. There is improved but still extensive opacity throughout the right upper lobe, now with evidence of some volume loss given mild rightward mediastinal shift. Some of this opacity in this region may be pleural soft tissue or loculated fluid. There is evidence of a small pleural effusion at the right lung base. The left lung remains clear. No pneumothorax is seen. No acute osseous abnormality is identified.  IMPRESSION: 1. Mildly improved aeration of the right upper lobe, however extensive parenchymal and pleural opacity remains with evidence of a degree of volume loss. This presumably relates to patient's history of lung cancer, and comparison with any more recent outside imaging is suggested. 2. Small right pleural effusion at the base. 3. Right jugular Port-A-Cath terminates over the mid right atrium.   Electronically Signed   By: Logan Bores M.D.   On: 07/17/2015 15:32   Ct Head Wo Contrast  07/17/2015   CLINICAL DATA:  Altered mental status. Very lethargic for the last few days. Right-sided weakness. History of stage IV lung cancer.  EXAM: CT HEAD WITHOUT CONTRAST  TECHNIQUE: Contiguous axial images were obtained from the base of the skull through the vertex without intravenous contrast.  COMPARISON:  05/05/2014  FINDINGS: There are multiple new brain lesions most consistent with metastases. A low-density/cystic lesion in the left parietal lobe measures 3.4 cm with mild surrounding vasogenic edema. There is a mildly hyperattenuating lesion in the superior left temporal lobe which measures approximately 2 cm with mild-to-moderate surrounding edema. A small mildly hyperattenuating lesion in the right frontal operculum measure 7 mm with mild edema. There may be an additional 1 cm lesion in the left centrum semiovale anterior to the larger cystic lesion. There is at most minimal mass effect on the left lateral ventricle without midline shift. No acute large  territory infarct, acute intracranial hemorrhage, or extra-axial fluid collection is seen. Incidental note is made of a cavum septum pellucidum et vergae.  Prior bilateral cataract extraction is noted. Visualized paranasal sinuses and mastoid air cells are clear. No suspicious skull lesions are identified.  IMPRESSION: At least 3-4 new brain lesions with mild-to-moderate associated edema, consistent with metastases. No significant mass effect. Recommend further evaluation with brain MRI.   Electronically Signed   By: Logan Bores M.D.   On: 07/17/2015 16:56     I have personally reviewed and evaluated these images and lab results as part of my medical decision-making.   EKG Interpretation   Date/Time:  Sunday July 17 2015 13:52:44 EDT Ventricular Rate:  81 PR Interval:  148 QRS Duration: 101 QT Interval:  386 QTC Calculation: 448 R Axis:   36 Text Interpretation:  Sinus rhythm Probable left atrial enlargement RSR'  in V1 or V2, right VCD or RVH Baseline wander in lead(s) II III Sinus  rhythm RSR prime Artifact Abnormal ekg Confirmed by Carmin Muskrat  MD  [id number]) on 07/17/2015 1:57:12 PM      MDM   Final diagnoses:  Altered mental status  Brain lesion   49 year old male with PMH of small cell lung cancer presents with altered mental status x 2 days. Wife reports he has been weak, fatigued, and confused for the past 2 days. Patient complains of cough, shortness of breath, mild headache, chills.  Patient is afebrile. Vital signs stable. Patient is alert and oriented to person and place, but answers remaining orientation questions inappropriately. EKG no acute ischemia, troponin negative x 1. CBC with hemoglobin 11.3, negative for leukocytosis. CMP with elevated creatinine at 1.53. Lactic acid 0.48. UA negative for infection. CXR demonstrates parenchymal and pleural opacity of right upper lobe with evidence of volume loss, small right pleural effusion at base. Head CT reveals 3-4 new  brain lesions with mild to moderate edema, consistent with metastases.  Discussed findings with patient at length, and explained plan to admit patient. Hospitalist consulted. Spoke with Dr. Wynelle Cleveland, who will admit.  BP 115/69 mmHg  Pulse 87  Temp(Src) 98.5 F (36.9 C)  Resp 22  SpO2 96%    Marella Chimes, PA-C 07/17/15 1813  Carmin Muskrat, MD 07/18/15 1627

## 2015-07-18 ENCOUNTER — Encounter: Payer: Self-pay | Admitting: Radiation Oncology

## 2015-07-18 ENCOUNTER — Encounter: Payer: Self-pay | Admitting: Radiation Therapy

## 2015-07-18 ENCOUNTER — Other Ambulatory Visit: Payer: Self-pay | Admitting: Radiation Therapy

## 2015-07-18 ENCOUNTER — Ambulatory Visit: Payer: Medicaid Other | Attending: Radiation Oncology | Admitting: Radiation Oncology

## 2015-07-18 DIAGNOSIS — I10 Essential (primary) hypertension: Secondary | ICD-10-CM

## 2015-07-18 DIAGNOSIS — C3490 Malignant neoplasm of unspecified part of unspecified bronchus or lung: Secondary | ICD-10-CM

## 2015-07-18 DIAGNOSIS — C7931 Secondary malignant neoplasm of brain: Secondary | ICD-10-CM

## 2015-07-18 LAB — BASIC METABOLIC PANEL
Anion gap: 11 (ref 5–15)
BUN: 15 mg/dL (ref 6–20)
CHLORIDE: 108 mmol/L (ref 101–111)
CO2: 23 mmol/L (ref 22–32)
Calcium: 9.5 mg/dL (ref 8.9–10.3)
Creatinine, Ser: 1.26 mg/dL — ABNORMAL HIGH (ref 0.61–1.24)
GFR calc Af Amer: >60 mL/min (ref >60)
GFR calc non Af Amer: >60 mL/min (ref >60)
Glucose, Bld: 131 mg/dL — ABNORMAL HIGH (ref 65–99)
POTASSIUM: 3.7 mmol/L (ref 3.5–5.1)
SODIUM: 142 mmol/L (ref 135–145)

## 2015-07-18 LAB — CBC
HEMATOCRIT: 34.5 % — AB (ref 39.0–52.0)
Hemoglobin: 11.7 g/dL — ABNORMAL LOW (ref 13.0–17.0)
MCH: 33.1 pg (ref 26.0–34.0)
MCHC: 33.9 g/dL (ref 30.0–36.0)
MCV: 97.5 fL (ref 78.0–100.0)
Platelets: 142 10*3/uL — ABNORMAL LOW (ref 150–400)
RBC: 3.54 MIL/uL — AB (ref 4.22–5.81)
RDW: 14.1 % (ref 11.5–15.5)
WBC: 5.1 10*3/uL (ref 4.0–10.5)

## 2015-07-18 LAB — GLUCOSE, CAPILLARY
GLUCOSE-CAPILLARY: 135 mg/dL — AB (ref 65–99)
GLUCOSE-CAPILLARY: 172 mg/dL — AB (ref 65–99)

## 2015-07-18 MED ORDER — CLONAZEPAM 1 MG PO TABS: 0.5000 mg | ORAL_TABLET | Freq: Three times a day (TID) | ORAL | Status: DC | PRN

## 2015-07-18 MED ORDER — GLIPIZIDE 5 MG PO TABS: 5.0000 mg | ORAL_TABLET | Freq: Every day | ORAL | Status: DC

## 2015-07-18 MED ORDER — BISACODYL 10 MG RE SUPP: 10.0000 mg | Freq: Every day | RECTAL | Status: DC | PRN

## 2015-07-18 MED ORDER — POLYETHYLENE GLYCOL 3350 17 G PO PACK: 17.0000 g | PACK | Freq: Two times a day (BID) | ORAL | Status: AC | PRN

## 2015-07-18 MED ORDER — DOCUSATE SODIUM 100 MG PO CAPS: 200.0000 mg | ORAL_CAPSULE | Freq: Every day | ORAL | Status: AC

## 2015-07-18 MED ORDER — DEXAMETHASONE 4 MG PO TABS: 4.0000 mg | ORAL_TABLET | Freq: Four times a day (QID) | ORAL | Status: DC

## 2015-07-18 MED ORDER — POLYETHYLENE GLYCOL 3350 17 G PO PACK: 17.0000 g | PACK | Freq: Two times a day (BID) | ORAL | Status: DC | PRN

## 2015-07-18 MED ORDER — DOCUSATE SODIUM 100 MG PO CAPS: 200.0000 mg | ORAL_CAPSULE | Freq: Every day | ORAL | Status: DC

## 2015-07-18 MED ORDER — HEPARIN SOD (PORK) LOCK FLUSH 100 UNIT/ML IV SOLN
500.0000 [IU] | INTRAVENOUS | Status: AC | PRN
  Administered 2015-07-18: 500 [IU]

## 2015-07-18 MED ORDER — POLYETHYLENE GLYCOL 3350 17 G PO PACK
17.0000 g | PACK | Freq: Two times a day (BID) | ORAL | Status: DC | PRN
Start: 2015-07-18 — End: 2015-07-18

## 2015-07-18 MED ORDER — HEPARIN SOD (PORK) LOCK FLUSH 100 UNIT/ML IV SOLN
500.0000 [IU] | Freq: Once | INTRAVENOUS | Status: DC
  Filled 2015-07-18: qty 5

## 2015-07-18 MED ORDER — OXYCODONE HCL 5 MG PO TABS: 5.0000 mg | ORAL_TABLET | Freq: Four times a day (QID) | ORAL | Status: DC | PRN

## 2015-07-18 MED ORDER — CARVEDILOL 25 MG PO TABS: 25.0000 mg | ORAL_TABLET | Freq: Two times a day (BID) | ORAL | Status: DC

## 2015-07-18 NOTE — Progress Notes (Signed)
.  1.  Do you need a wheel chair?    no  2. On oxygen? no  3. Have you ever had any surgery in the body part being scanned?  no  4. Have you ever had any surgery on your brain or heart?                                    no  5. Have you ever had surgery on your eyes or ears?                                         Eyes-glaucoma  6. Do you have a pacemaker or defibrillator?  no   7. Do you have a Neurostimulator?  No  8. Claustrophobic? no  9. Any risk for metal in eyes?  no  10. Injury by bullet, buckshot, or shrapnel?  no  11. Stent?         no                                                                                                                12. Hx of Cancer?        Yes,    Lung cancer                                                                                                     13. Kidney or Liver disease?  no  14. Hx of Lupus, Rheumatoid Arthritis or Scleroderma?  no  15. IV Antibiotics or long term use of NSAIDS?  no  16. HX of Hypertension? yes  17. Diabetic:   yes  18. Allergy to contrast?  no  19. Recent labs. In [place] [facility]igator

## 2015-07-18 NOTE — Clinical Documentation Improvement (Signed)
Hospitalist  Based on the clinical findings below, please document any associated diagnoses/conditions the patient has or may have.   Cerebral Edema    Other  Clinically Undetermined  Supporting Information:  CT Head 07/17/15 FINDINGS: There are multiple new brain lesions most consistent with metastases. A low-density/cystic lesion in the left parietal lobe measures 3.4 cm with mild surrounding vasogenic edema  Please exercise your independent, professional judgment when responding. A specific answer is not anticipated or expected.  Thank You, Alessandra Grout, RN, BSN, CCDS,Clinical Documentation Specialist:  [phone]  [phone]=[facility] [place]- Health Information Management

## 2015-07-18 NOTE — Progress Notes (Signed)
Pt and wife asked for his Trazodone to help his sleep. I told them it was not ordered and not on the med list. Wife stated she forgot to mention it. Notified provider on call, obtained one time order for 100 mg pill. Was advised to tell wife and pt to discuss it with the MD they see today about getting it ordered prn at night. Told this info to pt and wife. Wife verbalized understanding.

## 2015-07-18 NOTE — Discharge Summary (Addendum)
Physician Discharge Summary  Glen Mendez PZW:[id number] DOB: 1966/04/28 DOA: 07/17/2015  PCP: Leamon Arnt, MD  Admit date: 07/17/2015 Discharge date: 07/18/2015  Time spent: 50 minutes  Recommendations for Outpatient Follow-up:   1. PCP to adjust diabetic medications if sugars remain elevated- have discussed with patient and wife in detail 2. Radiation oncology to taper Decadron- d/w Dr Sondra Come 3. Follow BP and resume meds as needed  Discharge Condition: stable    Discharge Diagnoses:  Principal Problem:   Brain metastases Active Problems:   Small cell lung cancer   DM type 2 (diabetes mellitus, type 2)   HTN (hypertension)   Bipolar 1 disorder   History of present illness:  Glen Mendez is a 49 y.o. male With h/o small cell lung CA status post chemoradiation, smoker with COPD, bipolar disorder, hypertension and diabetes on oral medication He presents to the hospital for headaches, slurred speech, repetitive speech, drowsiness, disoriention and stumbling gate as explained by his wife. She states that she has noticed this for the past couple of days. He is not able to give me a good history and is essentially asking to be released to go home. According to the wife he is been stumbling but has not fallen. He has been confused and having trouble with speech as mentioned above. He has been taking indomethacin for headaches which is not helping. His wife states that he also had a syncopal episode today. He was laying in bed at that time. There was no shaking tongue biting or loss of control of bowel or bladder. The episode lasted for 5 minutes and he was quite oriented when he awoke.  Hospital Course:  Principal Problem:  Brain metastases-  - patient quite hesitant to stay in the hospital but convinced to stay at lease one night - history of Small cell lung cancer- T2a, N2, M0 diagnosed in October 2015 - s/p Chemo/radiation which ended in December of last year- has been on  observation being monitored by Dr. Earlie Server- spoke with Dr Julien Nordmann- recommended to consult radiation oncology in the hospital and ask him to follow up with him (Dr Julien Nordmann) in the office.  -give one dose of IV Decadron and started oral Decadron- symptoms significantly improved per patient and his wife-  headache better, ambulation has improved- walking independently without significant issues- PT ordered but patient hesitant to stay to have eval done-  - have spoken with Dr Sondra Come from Radiation oncology- he has reviewed the chart and will set him up for an outpt appt later this week - avoid anticoagulation- d/c'd Tramadol to prevent seizures - PRn Oxycodone or Tylenol for headaches   Active Problems:  Syncope? - occurred while in bed- could have simply fallen asleep - Question Seizure but no outward seizure like activity- no further episodes in the hospital - discussed with Neuro- no need for prophylactic AEDs - doubtful that it is cardiac as he has no cardiac issues  opacity in RUL -occasional cough, non-productive, no fever, no leukocytosis-  possibly from prior radiation to chest   DM type 2 (diabetes mellitus, type 2) -hold metformin as Cr was 1.5 on admission improving to 1.26- he is at risk of it rising gain - advised to check sugars QID which will be likely be elevated on Decadron - they are to call his PCP if sugars> 200 - will start low dose Glipizide-  Hypotension with a history of HTN (hypertension) - advised to resume Coreg if SBP < 130 at home-  they have a BP apparatus at home - d/c Lisinopril for now   Bipolar 1 disorder -Stable on Seroquel and when necessary Klonopin  CKD 3 - stable- Cr improved from 1.53 to 1.26  COPD/ cough - wife states he uses Atrovent/ albuterol nebs ocassionally -not on med rec-  ordered PRN - monitor cough- currently no sputum  Smoker - Nicotine patch ordered- he does not want  prescriptions  Procedures: None  Consultations:  Phone consult with radiation and medical oncology  Discharge Exam: Filed Weights   07/17/15 1745  Weight: 98.6 kg (217 lb 6 oz)   Filed Vitals:   07/18/15 0650  BP: 105/65  Pulse: 96  Temp: 97.6 F (36.4 C)  Resp: 18    General: AAO x 3, no distress Cardiovascular: RRR, no murmurs  Respiratory: clear to auscultation bilaterally GI: soft, non-tender, non-distended, bowel sound positive  Discharge Instructions You were cared for by a hospitalist during your hospital stay. If you have any questions about your discharge medications or the care you received while you were in the hospital after you are discharged, you can call the unit and asked to speak with the hospitalist on call if the hospitalist that took care of you is not available. Once you are discharged, your primary care physician will handle any further medical issues. Please note that NO REFILLS for any discharge medications will be authorized once you are discharged, as it is imperative that you return to your primary care physician (or establish a relationship with a primary care physician if you do not have one) for your aftercare needs so that they can reassess your need for medications and monitor your lab values.      Discharge Instructions    Discharge instructions    Complete by:  As directed   Diabetic low sodium heart healthy Avoid Aspirin, Motrin, Alleve for now Check sugars 4 x day as discussed- Call PCP if sugars continuously > 200     Increase activity slowly    Complete by:  As directed             Medication List    STOP taking these medications        indomethacin 75 MG CR capsule  Commonly known as:  INDOCIN SR     lisinopril 10 MG tablet  Commonly known as:  PRINIVIL,ZESTRIL     metFORMIN 500 MG tablet  Commonly known as:  GLUCOPHAGE     traMADol 50 MG tablet  Commonly known as:  ULTRAM      TAKE these medications         amitriptyline 25 MG tablet  Commonly known as:  ELAVIL  Take 25 mg by mouth at bedtime.     bisacodyl 10 MG suppository  Commonly known as:  DULCOLAX  Place 1 suppository (10 mg total) rectally daily as needed for severe constipation.     carvedilol 25 MG tablet  Commonly known as:  COREG  Take 1 tablet (25 mg total) by mouth 2 (two) times daily with a meal.     clonazePAM 1 MG tablet  Commonly known as:  KLONOPIN  Take 0.5-1 tablets (0.5-1 mg total) by mouth 3 (three) times daily as needed for anxiety.     dexamethasone 4 MG tablet  Commonly known as:  DECADRON  Take 1 tablet (4 mg total) by mouth every 6 (six) hours.     docusate sodium 100 MG capsule  Commonly known as:  COLACE  Take  2 capsules (200 mg total) by mouth at bedtime.     glipiZIDE 5 MG tablet  Commonly known as:  GLUCOTROL  Take 1 tablet (5 mg total) by mouth daily before breakfast.     oxyCODONE 5 MG immediate release tablet  Commonly known as:  Oxy IR/ROXICODONE  Take 1-2 tablets (5-10 mg total) by mouth every 6 (six) hours as needed for moderate pain (5 more moderate pain, 10 mg for severe pain).     polyethylene glycol packet  Commonly known as:  MIRALAX / GLYCOLAX  Take 17 g by mouth 2 (two) times daily as needed for mild constipation.     QUEtiapine 400 MG tablet  Commonly known as:  SEROQUEL  Take 400 mg by mouth at bedtime.     ranitidine 150 MG tablet  Commonly known as:  ZANTAC  Take 150 mg by mouth 2 (two) times daily.       Allergies  Allergen Reactions  . Vicodin [Hydrocodone-Acetaminophen]     " makes me itch" i can take it if I take bendryl   Follow-up Information    Follow up with Gery Pray, MD.   Specialty:  Radiation Oncology   Why:  for radiation- their office will call you with an appointment    Contact information:   [place] [address] [phone]       Follow up with Eilleen Kempf., MD.   Specialty:  Oncology   Why:  as soon as  possible    Contact information:   [place] [address] [phone]        The results of significant diagnostics from this hospitalization (including imaging, microbiology, ancillary and laboratory) are listed below for reference.    Significant Diagnostic Studies: Dg Chest 2 View  07/17/2015   CLINICAL DATA:  Cough and altered mental status. History of lung cancer and COPD.  EXAM: CHEST  2 VIEW  COMPARISON:  08/06/2014  FINDINGS: Right jugular Port-A-Cath has been placed and terminates over the mid right atrium. Cardiac silhouette is within normal limits for AP technique. There is improved but still extensive opacity throughout the right upper lobe, now with evidence of some volume loss given mild rightward mediastinal shift. Some of this opacity in this region may be pleural soft tissue or loculated fluid. There is evidence of a small pleural effusion at the right lung base. The left lung remains clear. No pneumothorax is seen. No acute osseous abnormality is identified.  IMPRESSION: 1. Mildly improved aeration of the right upper lobe, however extensive parenchymal and pleural opacity remains with evidence of a degree of volume loss. This presumably relates to patient's history of lung cancer, and comparison with any more recent outside imaging is suggested. 2. Small right pleural effusion at the base. 3. Right jugular Port-A-Cath terminates over the mid right atrium.   Electronically Signed   By: Logan Bores M.D.   On: 07/17/2015 15:32   Ct Head Wo Contrast  07/17/2015   CLINICAL DATA:  Altered mental status. Very lethargic for the last few days. Right-sided weakness. History of stage IV lung cancer.  EXAM: CT HEAD WITHOUT CONTRAST  TECHNIQUE: Contiguous axial images were obtained from the base of the skull through the vertex without intravenous contrast.  COMPARISON:  05/05/2014  FINDINGS: There are multiple new brain lesions most consistent with metastases. A  low-density/cystic lesion in the left parietal lobe measures 3.4 cm with mild surrounding vasogenic edema. There is a  mildly hyperattenuating lesion in the superior left temporal lobe which measures approximately 2 cm with mild-to-moderate surrounding edema. A small mildly hyperattenuating lesion in the right frontal operculum measure 7 mm with mild edema. There may be an additional 1 cm lesion in the left centrum semiovale anterior to the larger cystic lesion. There is at most minimal mass effect on the left lateral ventricle without midline shift. No acute large territory infarct, acute intracranial hemorrhage, or extra-axial fluid collection is seen. Incidental note is made of a cavum septum pellucidum et vergae.  Prior bilateral cataract extraction is noted. Visualized paranasal sinuses and mastoid air cells are clear. No suspicious skull lesions are identified.  IMPRESSION: At least 3-4 new brain lesions with mild-to-moderate associated edema, consistent with metastases. No significant mass effect. Recommend further evaluation with brain MRI.   Electronically Signed   By: Logan Bores M.D.   On: 07/17/2015 16:56    Microbiology: No results found for this or any previous visit (from the past 240 hour(s)).   Labs: Basic Metabolic Panel:  Recent Labs Lab 07/17/15 1524 07/18/15 0518  NA 142 142  K 3.5 3.7  CL 110 108  CO2 25 23  GLUCOSE 88 131*  BUN 14 15  CREATININE 1.53* 1.26*  CALCIUM 9.0 9.5   Liver Function Tests:  Recent Labs Lab 07/17/15 1524  AST 13*  ALT 9*  ALKPHOS 63  BILITOT 0.5  PROT 6.5  ALBUMIN 3.5   No results for input(s): LIPASE, AMYLASE in the last 168 hours. No results for input(s): AMMONIA in the last 168 hours. CBC:  Recent Labs Lab 07/17/15 1524 07/18/15 0518  WBC 5.9 5.1  NEUTROABS 4.1  --   HGB 11.3* 11.7*  HCT 33.3* 34.5*  MCV 97.7 97.5  PLT 137* 142*   Cardiac Enzymes: No results for input(s): CKTOTAL, CKMB, CKMBINDEX, TROPONINI in the  last 168 hours. BNP: BNP (last 3 results) No results for input(s): BNP in the last 8760 hours.  ProBNP (last 3 results) No results for input(s): PROBNP in the last 8760 hours.  CBG:  Recent Labs Lab 07/17/15 1345 07/17/15 1851 07/17/15 2304 07/18/15 0804 07/18/15 1209  GLUCAP 98 78 136* 135* 172*       SignedDebbe Odea, MD [facility] 07/18/2015, 12:36 PM

## 2015-07-18 NOTE — Progress Notes (Signed)
Radiation Oncology         [phone] ________________________________  Initial inpatient Consultation  Name: Glen Mendez MRN: [id number]  Date: 07/18/2015  DOB: Dec 04, 1965  SW:FUXN,ATFTDDU L, MD  No ref. provider found   REFERRING PHYSICIAN: No ref. provider found  DIAGNOSIS:  Brain metastases, small cell lung cancer, C79.31  HISTORY OF PRESENT ILLNESS::Glen Mendez is a 49 y.o. male who presented in October 2015 with dyspnea and a mass in the right upper lobe. Per med/onc notes (Dr Julien Nordmann) PET scan performed at that time showed a right upper lobe lung lesion and right paratracheal nodes as well as subcarinal lymphadenopathy. MRI of the brain originally was negative for metastatic disease. 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  Dr. Daphene Jaeger delivered cisplatin and etoposide for total of 4 cycles completed 11/11/2014.  He also received concurrent chest RT, records pending. The patient also underwent a prophylactic cranial irradiation completed on 01/03/2015 per Dr. Worthy Flank note.  Per Patient and wife, this occurred over 10 treatments; records pending.  The patient had plans to follow with Dr. Julien Nordmann here in [facility] after completion of treatments.  He was doing relatively well until he developed slurred speech, cognitive slowing, gait change, HA's, and slight right sided weakness.  CT of head without contrast yesterday revealed at least 3-4 lesions, the largest of which is 3.4 cm in the left parietal lobe. I have reviewed his images.    He is feeling a little better after starting decadron.  He is very anxious to return home but was willing to stay for an inpatient consultation with me.   PREVIOUS RADIATION THERAPY: Yes as above  PAST MEDICAL HISTORY:  has a past medical history of Lung cancer; Bipolar disorder; COPD (chronic obstructive pulmonary disease); Hypertension; and Diabetes mellitus without  complication.    PAST SURGICAL HISTORY:No past surgical history on file.  FAMILY HISTORY: family history is not on file. He was adopted.  SOCIAL HISTORY:  reports that he has been smoking Cigarettes.  He has a 40 pack-year smoking history. He has never used smokeless tobacco. He reports that he uses illicit drugs (Marijuana). He reports that he does not drink alcohol.  ALLERGIES: Vicodin  MEDICATIONS:  Current Outpatient Prescriptions  Medication Sig Dispense Refill  . amitriptyline (ELAVIL) 25 MG tablet Take 25 mg by mouth at bedtime.    . bisacodyl (DULCOLAX) 10 MG suppository Place 1 suppository (10 mg total) rectally daily as needed for severe constipation. 12 suppository 0  . carvedilol (COREG) 25 MG tablet Take 1 tablet (25 mg total) by mouth 2 (two) times daily with a meal.    . clonazePAM (KLONOPIN) 1 MG tablet Take 0.5-1 tablets (0.5-1 mg total) by mouth 3 (three) times daily as needed for anxiety. 30 tablet 0  . dexamethasone (DECADRON) 4 MG tablet Take 1 tablet (4 mg total) by mouth every 6 (six) hours. 60 tablet 0  . docusate sodium (COLACE) 100 MG capsule Take 2 capsules (200 mg total) by mouth at bedtime. 30 capsule 0  . glipiZIDE (GLUCOTROL) 5 MG tablet Take 1 tablet (5 mg total) by mouth daily before breakfast. 30 tablet 0  . oxyCODONE (OXY IR/ROXICODONE) 5 MG immediate release tablet Take 1-2 tablets (5-10 mg total) by mouth every 6 (six) hours as needed for moderate pain (5 more moderate pain, 10 mg for severe pain). 60 tablet 0  . polyethylene glycol (MIRALAX / GLYCOLAX) packet Take 17 g  by mouth 2 (two) times daily as needed for mild constipation. 14 each 0  . QUEtiapine (SEROQUEL) 400 MG tablet Take 400 mg by mouth at bedtime.    . ranitidine (ZANTAC) 150 MG tablet Take 150 mg by mouth 2 (two) times daily.     No current facility-administered medications for this visit.    REVIEW OF SYSTEMS:  Notable for that above.   PHYSICAL EXAM:  vitals were not taken for this  visit.  General: Alert , in no acute distress HEENT: Head is normocephalic. Extraocular movements are intact.  Oropharynx clear - no thrush Neck: Neck is supple, no palpable cervical or supraclavicular lymphadenopathy. Heart: Regular in rate and rhythm with no murmurs, rubs, or gallops. Chest: Clear to auscultation bilaterally, with no rhonchi, wheezes, or rales. Abdomen: Soft, nontender, nondistended, with no rigidity or guarding. Extremities: No cyanosis or edema. Lymphatics: see Neck Exam Skin: No concerning lesions. Musculoskeletal: mild right leg weakness upon hip flexion Neurologic: Cranial nerves II through XII are grossly intact.  Speech is slightly slurred. Coordination is wobbly on finger to nose. Denies numbness. 1/3 object recall at 3 minutes. Psychiatric:   Affect is appropriate.    ECOG = 2  0 - Asymptomatic (Fully active, able to carry on all predisease activities without restriction)  1 - Symptomatic but completely ambulatory (Restricted in physically strenuous activity but ambulatory and able to carry out work of a light or sedentary nature. For example, light housework, office work)  2 - Symptomatic, <50% in bed during the day (Ambulatory and capable of all self care but unable to carry out any work activities. Up and about more than 50% of waking hours)  3 - Symptomatic, >50% in bed, but not bedbound (Capable of only limited self-care, confined to bed or chair 50% or more of waking hours)  4 - Bedbound (Completely disabled. Cannot carry on any self-care. Totally confined to bed or chair)  5 - Death   [name] MM, Creech RH, Tormey DC, et al. [phone]). "Toxicity and response criteria of the [facility]". [place] Oncol. 5 (6): 649-55   LABORATORY DATA:  Lab Results  Component Value Date   WBC 5.1 07/18/2015   HGB 11.7* 07/18/2015   HCT 34.5* 07/18/2015   MCV 97.5 07/18/2015   PLT 142* 07/18/2015   CMP     Component Value Date/Time    NA 142 07/18/2015 0518   NA 140 05/11/2015 1609   K 3.7 07/18/2015 0518   K 3.5 05/11/2015 1609   CL 108 07/18/2015 0518   CO2 23 07/18/2015 0518   CO2 23 05/11/2015 1609   GLUCOSE 131* 07/18/2015 0518   GLUCOSE 88 05/11/2015 1609   BUN 15 07/18/2015 0518   BUN 11.3 05/11/2015 1609   CREATININE 1.26* 07/18/2015 0518   CREATININE 1.4* 05/11/2015 1609   CALCIUM 9.5 07/18/2015 0518   CALCIUM 9.7 05/11/2015 1609   PROT 6.5 07/17/2015 1524   PROT 7.0 05/11/2015 1609   ALBUMIN 3.5 07/17/2015 1524   ALBUMIN 3.8 05/11/2015 1609   AST 13* 07/17/2015 1524   AST 9 05/11/2015 1609   ALT 9* 07/17/2015 1524   ALT <6 05/11/2015 1609   ALKPHOS 63 07/17/2015 1524   ALKPHOS 77 05/11/2015 1609   BILITOT 0.5 07/17/2015 1524   BILITOT 0.31 05/11/2015 1609   GFRNONAA >60 07/18/2015 0518   GFRAA >60 07/18/2015 0518         RADIOGRAPHY: Dg Chest 2 View  07/17/2015  CLINICAL DATA:  Cough and altered mental status. History of lung cancer and COPD.  EXAM: CHEST  2 VIEW  COMPARISON:  08/06/2014  FINDINGS: Right jugular Port-A-Cath has been placed and terminates over the mid right atrium. Cardiac silhouette is within normal limits for AP technique. There is improved but still extensive opacity throughout the right upper lobe, now with evidence of some volume loss given mild rightward mediastinal shift. Some of this opacity in this region may be pleural soft tissue or loculated fluid. There is evidence of a small pleural effusion at the right lung base. The left lung remains clear. No pneumothorax is seen. No acute osseous abnormality is identified.  IMPRESSION: 1. Mildly improved aeration of the right upper lobe, however extensive parenchymal and pleural opacity remains with evidence of a degree of volume loss. This presumably relates to patient's history of lung cancer, and comparison with any more recent outside imaging is suggested. 2. Small right pleural effusion at the base. 3. Right jugular Port-A-Cath  terminates over the mid right atrium.   Electronically Signed   By: Logan Bores M.D.   On: 07/17/2015 15:32   Ct Head Wo Contrast  07/17/2015   CLINICAL DATA:  Altered mental status. Very lethargic for the last few days. Right-sided weakness. History of stage IV lung cancer.  EXAM: CT HEAD WITHOUT CONTRAST  TECHNIQUE: Contiguous axial images were obtained from the base of the skull through the vertex without intravenous contrast.  COMPARISON:  05/05/2014  FINDINGS: There are multiple new brain lesions most consistent with metastases. A low-density/cystic lesion in the left parietal lobe measures 3.4 cm with mild surrounding vasogenic edema. There is a mildly hyperattenuating lesion in the superior left temporal lobe which measures approximately 2 cm with mild-to-moderate surrounding edema. A small mildly hyperattenuating lesion in the right frontal operculum measure 7 mm with mild edema. There may be an additional 1 cm lesion in the left centrum semiovale anterior to the larger cystic lesion. There is at most minimal mass effect on the left lateral ventricle without midline shift. No acute large territory infarct, acute intracranial hemorrhage, or extra-axial fluid collection is seen. Incidental note is made of a cavum septum pellucidum et vergae.  Prior bilateral cataract extraction is noted. Visualized paranasal sinuses and mastoid air cells are clear. No suspicious skull lesions are identified.  IMPRESSION: At least 3-4 new brain lesions with mild-to-moderate associated edema, consistent with metastases. No significant mass effect. Recommend further evaluation with brain MRI.   Electronically Signed   By: Logan Bores M.D.   On: 07/17/2015 16:56      IMPRESSION/PLAN: Today, I talked to the patient and his wife about the findings and work-up thus far. We discussed the patient's diagnosis of brain metastases and general treatment for this, highlighting the role of radiotherapy in the management. We  discussed the available radiation techniques, and focused on the details of logistics and delivery. I will order a 3T brain MRI w/ contrast  to determine if repeat WBRT vs SRS is prudent for his case, and arrange followup in my office with simulation soon after imaging.  Will try to obtain outside records. Will present his case at CNS tumor board.  The patient and his wife were encouraged to ask questions that I answered to the best of my ability. They are enthusiastic to proceed with this plan.     __________________________________________   Eppie Gibson, MD

## 2015-07-18 NOTE — Evaluation (Addendum)
Physical Therapy Evaluation Patient Details Name: Glen Mendez MRN: [id number] DOB: Jul 03, 1966 Today's Date: 07/18/2015   History of Present Illness  49 y.o. 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 Imaging showed new brain mets.   Clinical Impression  Pt is independent with mobility, he walked 300' without assistive device, with no loss of balance and was able to perform high level balance activities without LOB. SaO2 98% on RA with walking.  Pt safe to DC home from PT standpoint. PT signing off as pt is independent with mobility.    Follow Up Recommendations No PT follow up    Equipment Recommendations  None recommended by PT    Recommendations for Other Services       Precautions / Restrictions Precautions Precautions: Fall Precaution Comments: pt reports 1 fall 3 months ago Restrictions Weight Bearing Restrictions: No      Mobility  Bed Mobility Overal bed mobility: Independent                Transfers Overall transfer level: Independent                  Ambulation/Gait Ambulation/Gait assistance: Independent Ambulation Distance (Feet): 300 Feet Assistive device: None Gait Pattern/deviations: Wide base of support     General Gait Details: pt reports his feet turn out at baseline "I have duck feet", no LOB with head turns, no LOB with quick stop/start  Stairs            Wheelchair Mobility    Modified Rankin (Stroke Patients Only)       Balance Overall balance assessment: Independent                                           Pertinent Vitals/Pain Pain Assessment: No/denies pain    Home Living Family/patient expects to be discharged to:: Private residence Living Arrangements: Spouse/significant other;Children;Parent Available Help at Discharge: Family;Available 24  hours/day Type of Home: House       Home Layout: Two level Home Equipment: Walker - 2 wheels;Tub bench      Prior Function Level of Independence: Independent               Hand Dominance        Extremity/Trunk Assessment   Upper Extremity Assessment: Overall WFL for tasks assessed           Lower Extremity Assessment: Overall WFL for tasks assessed      Cervical / Trunk Assessment: Normal  Communication   Communication: Expressive difficulties (occaissional word finding difficulty)  Cognition Arousal/Alertness: Awake/alert Behavior During Therapy: WFL for tasks assessed/performed Overall Cognitive Status: Within Functional Limits for tasks assessed                      General Comments General comments (skin integrity, edema, etc.): pt able to reach to floor, stand with feet together, and turn 360* in standing without LOB    Exercises        Assessment/Plan    PT Assessment Patent does not need any further PT services  PT Diagnosis     PT Problem List    PT Treatment Interventions     PT Goals (Current goals can be found in the Care Plan section)  Acute Rehab PT Goals Patient Stated Goal: to go home PT Goal Formulation: All assessment and education complete, DC therapy    Frequency     Barriers to discharge        Co-evaluation               End of Session Equipment Utilized During Treatment: Gait belt Activity Tolerance: Patient tolerated treatment well Patient left: in bed;with bed alarm set;with call bell/phone within reach Nurse Communication: Mobility status         Time: 4540-9811 PT Time Calculation (min) (ACUTE ONLY): 18 min   Charges:   PT Evaluation $Initial PT Evaluation Tier I: 1 Procedure     PT G CodesPhilomena Mendez 07/18/2015, 12:16 PM [phone]

## 2015-07-20 ENCOUNTER — Other Ambulatory Visit: Payer: Self-pay | Admitting: Radiation Oncology

## 2015-07-20 ENCOUNTER — Ambulatory Visit: Payer: Medicaid Other

## 2015-07-20 ENCOUNTER — Ambulatory Visit: Payer: Self-pay | Admitting: Radiation Oncology

## 2015-07-21 ENCOUNTER — Telehealth: Payer: Self-pay | Admitting: *Deleted

## 2015-07-22 ENCOUNTER — Encounter: Payer: Self-pay | Admitting: Radiation Oncology

## 2015-07-22 ENCOUNTER — Ambulatory Visit (HOSPITAL_COMMUNITY)
Admission: RE | Admit: 2015-07-22 | Discharge: 2015-07-22 | Disposition: A | Discharge status: Home / Self Care | Payer: Medicaid Other | Source: Ambulatory Visit | Attending: Radiation Oncology | Admitting: Radiation Oncology

## 2015-07-22 DIAGNOSIS — C7931 Secondary malignant neoplasm of brain: Secondary | ICD-10-CM | POA: Insufficient documentation

## 2015-07-22 DIAGNOSIS — Z923 Personal history of irradiation: Secondary | ICD-10-CM | POA: Diagnosis not present

## 2015-07-22 DIAGNOSIS — C3490 Malignant neoplasm of unspecified part of unspecified bronchus or lung: Secondary | ICD-10-CM | POA: Insufficient documentation

## 2015-07-22 DIAGNOSIS — Z9221 Personal history of antineoplastic chemotherapy: Secondary | ICD-10-CM | POA: Insufficient documentation

## 2015-07-22 NOTE — Progress Notes (Signed)
Location/Histology of Brain Tumor: Brain metastases, CT of head from 07/17/15 revealed at least 3-4 lesions, the largest of which is 3.4 cm in the left parietal lobe  Patient presented with symptoms [id number] speech, cognitive slowing, gait change, HA's, and slight right sided weakness    Past or anticipated interventions, if any, per neurosurgery: possible gamma knife if radiation is not done  Past or anticipated interventions, if any, per medical oncology: had chemotherapy at [facility] by Dr. Andreas Ohm  Dose of Decadron, if applicable: 4 mg q 6 hours  Recent neurologic symptoms, if any:   Seizures: no  Headaches: yes - rating at an 8/10  Nausea: no  Dizziness/ataxia: 2-3 times a week, reports it does not last  Difficulty with hand coordination: no  Focal numbness/weakness: tingling in legs, right arm, right hand is shaky  Visual deficits/changes: no  Confusion/Memory deficits: has trouble finding words, reports this happens 2-3 times a week   Painful bone metastases at present, if any: no  SAFETY ISSUES:  Prior radiation? Yes - [facility], 10 days to brain, and 6 weeks to chest by Dr. Roxanne Mins  Pacemaker/ICD? no  Possible current pregnancy? no  Is the patient on methotrexate? no  Additional Complaints / other details: Patient seen as an inpatient.  Patient reports severe headaches.  He is taking 15 mg (3 tablets) of oxycodone q 6 hours without relief.  He has lost 100 lbs in the last year and is wondering about taking something for appetite.  BP 133/88 mmHg  Pulse 114  Temp(Src) 98 F (36.7 C) (Oral)  Ht '6\' 1"'$  (1.854 m)  Wt 202 lb 6.4 oz (91.808 kg)  BMI 26.71 kg/m2  SpO2 98%

## 2015-07-25 ENCOUNTER — Other Ambulatory Visit: Payer: Self-pay | Admitting: Radiation Oncology

## 2015-07-25 ENCOUNTER — Encounter: Payer: Self-pay | Admitting: Radiation Oncology

## 2015-07-25 ENCOUNTER — Ambulatory Visit
Admission: RE | Admit: 2015-07-25 | Discharge: 2015-07-25 | Disposition: A | Discharge status: Home / Self Care | Payer: Medicaid Other | Source: Ambulatory Visit | Attending: Radiation Oncology | Admitting: Radiation Oncology

## 2015-07-25 VITALS — BP 133/88 | HR 114 | Temp 98.0°F | Ht 73.0 in | Wt 202.4 lb

## 2015-07-25 DIAGNOSIS — Z510 Encounter for antineoplastic radiation therapy: Secondary | ICD-10-CM | POA: Diagnosis not present

## 2015-07-25 DIAGNOSIS — C7931 Secondary malignant neoplasm of brain: Secondary | ICD-10-CM | POA: Diagnosis not present

## 2015-07-25 HISTORY — DX: Secondary malignant neoplasm of brain: C79.31

## 2015-07-25 HISTORY — DX: Reserved for inherently not codable concepts without codable children: IMO0001

## 2015-07-25 HISTORY — DX: Reserved for concepts with insufficient information to code with codable children: IMO0002

## 2015-07-25 MED ORDER — DEXAMETHASONE 4 MG PO TABS: ORAL_TABLET | ORAL | Status: DC

## 2015-07-25 MED ORDER — OXYCODONE HCL 5 MG PO TABS: ORAL_TABLET | ORAL | Status: DC

## 2015-07-25 NOTE — Progress Notes (Addendum)
  Name: SIMON LLAMAS MRN: [id number]  Date: 07/25/2015  DOB: [birth year]  SIMULATION AND TREATMENT PLANNING NOTE    ICD-9-CM ICD-10-CM   1. Brain metastases 198.3 C79.31      NARRATIVE:  The patient was brought to the [facility].  Identity was confirmed.  All relevant records and images related to the planned course of therapy were reviewed.  The patient freely provided informed written consent to proceed with treatment after reviewing the details related to the planned course of therapy. The consent form was witnessed and verified by the simulation staff. Intravenous access was established for contrast administration. Then, the patient was set-up in a stable reproducible supine position for radiation therapy.  A relocatable thermoplastic stereotactic head frame was fabricated for precise immobilization.  CT images were obtained.  Surface markings were placed.  The CT images were loaded into the planning software and fused with the patient's targeting MRI scan.  Then the target and avoidance structures were contoured.  Treatment planning then occurred.  The radiation prescription was entered and confirmed.  I have requested 3D planning  I have requested a DVH of the following structures: Brain stem, brain, left eye, right eye, lenses, optic chiasm, target volumes, uninvolved brain, and normal tissue.    PLAN:  The patient will receive stereotactic radiosurgery to 6 brain lesions for salvage after previous PCI. Anticipate 3-5 fractions to each lesion.  SPECIAL TREATMENT PROCEDURE NOTE:  This constitutes a special treatment procedure due to the ablative dose that will be delivered and the technical nature of treatment.  This highly technical modality of treatment ensures that the ablative dose is centered on the patient's tumor while sparing normal tissues from excessive dose and risk of detrimental effects. -----------------------------------  Eppie Gibson, MD

## 2015-07-25 NOTE — Progress Notes (Signed)
Please see the Nurse Progress Note in the MD Initial Consult Encounter for this patient. 

## 2015-07-25 NOTE — Progress Notes (Signed)
Radiation Oncology         [phone] ________________________________  Name: Glen Mendez MRN: [id number]  Date: 07/25/2015  DOB: [birth year]  Follow-Up Visit Note  Outpatient  CC: Leamon Arnt, MD  Curt Bears, MD  Diagnosis and Prior Radiotherapy:    ICD-9-CM ICD-10-CM   1. Brain metastases 198.3 C79.31 dexamethasone (DECADRON) 4 MG tablet     Ambulatory referral to Social Work     oxyCODONE (OXY IR/ROXICODONE) 5 MG immediate release tablet    Completed 25 Gy in 10 fractions on 01-03-15 to whole brain for PCI Completed 60 Gy in 30 fractions to chest approximately in Dec 2015.  Narrative:  The patient returns today for routine follow-up.  He has been discharged.  Main complaint - severe HAs.  On decadron '4mg'$  QID and oxycodone 15 mg QID prn.  Still doesn't have systemic restaging scans or med/onc followup for this month. Denies seizures. Right handy is shaky. 3T MRI results /images reviewed at CNS tumor board this AM as well as with patient and wife.                            ALLERGIES:  is allergic to vicodin.  Meds: Current Outpatient Prescriptions  Medication Sig Dispense Refill  . albuterol (ACCUNEB) 0.63 MG/3ML nebulizer solution 1 ampule.    Marland Kitchen amitriptyline (ELAVIL) 25 MG tablet Take 25 mg by mouth at bedtime.    . clonazePAM (KLONOPIN) 1 MG tablet Take 0.5-1 tablets (0.5-1 mg total) by mouth 3 (three) times daily as needed for anxiety. 30 tablet 0  . dexamethasone (DECADRON) 4 MG tablet Take 2 tablets TID with food 90 tablet 0  . docusate sodium (COLACE) 100 MG capsule Take 2 capsules (200 mg total) by mouth at bedtime. 30 capsule 0  . fluticasone (FLONASE) 50 MCG/ACT nasal spray Place 1 spray into the nose.    Marland Kitchen glipiZIDE (GLUCOTROL) 5 MG tablet Take 1 tablet (5 mg total) by mouth daily before breakfast. 30 tablet 0  . ipratropium (ATROVENT HFA) 17 MCG/ACT inhaler Inhale 2 puffs into the lungs.    . ondansetron (ZOFRAN) 8 MG tablet Take 8 mg by mouth.    .  oxyCODONE (OXY IR/ROXICODONE) 5 MG immediate release tablet Take 1-2 tabs q 4hrs prn 60 tablet 0  . polyethylene glycol (MIRALAX / GLYCOLAX) packet Take 17 g by mouth 2 (two) times daily as needed for mild constipation. 14 each 0  . promethazine (PHENERGAN) 25 MG suppository Place 25 mg rectally.    Marland Kitchen QUEtiapine (SEROQUEL) 400 MG tablet Take 400 mg by mouth at bedtime.    . ranitidine (ZANTAC) 150 MG tablet Take 150 mg by mouth 2 (two) times daily.    . traZODone (DESYREL) 50 MG tablet Take 100 mg by mouth.    . bisacodyl (DULCOLAX) 10 MG suppository Place 1 suppository (10 mg total) rectally daily as needed for severe constipation. (Patient not taking: Reported on 07/25/2015) 12 suppository 0  . carvedilol (COREG) 25 MG tablet Take 1 tablet (25 mg total) by mouth 2 (two) times daily with a meal. (Patient not taking: Reported on 07/25/2015)     No current facility-administered medications for this encounter.    Physical Findings: The patient is in no acute distress. Patient is alert and oriented.  height is '6\' 1"'$  (1.854 m) and weight is 202 lb 6.4 oz (91.808 kg). His oral temperature is 98 F (36.7 C). His blood pressure  is 133/88 and his pulse is 114. His oxygen saturation is 98%. .    NAD.  Lab Findings: Lab Results  Component Value Date   WBC 5.1 07/18/2015   HGB 11.7* 07/18/2015   HCT 34.5* 07/18/2015   MCV 97.5 07/18/2015   PLT 142* 07/18/2015    Radiographic Findings: Dg Chest 2 View  07/17/2015   CLINICAL DATA:  Cough and altered mental status. History of lung cancer and COPD.  EXAM: CHEST  2 VIEW  COMPARISON:  08/06/2014  FINDINGS: Right jugular Port-A-Cath has been placed and terminates over the mid right atrium. Cardiac silhouette is within normal limits for AP technique. There is improved but still extensive opacity throughout the right upper lobe, now with evidence of some volume loss given mild rightward mediastinal shift. Some of this opacity in this region may be pleural  soft tissue or loculated fluid. There is evidence of a small pleural effusion at the right lung base. The left lung remains clear. No pneumothorax is seen. No acute osseous abnormality is identified.  IMPRESSION: 1. Mildly improved aeration of the right upper lobe, however extensive parenchymal and pleural opacity remains with evidence of a degree of volume loss. This presumably relates to patient's history of lung cancer, and comparison with any more recent outside imaging is suggested. 2. Small right pleural effusion at the base. 3. Right jugular Port-A-Cath terminates over the mid right atrium.   Electronically Signed   By: Logan Bores M.D.   On: 07/17/2015 15:32   Ct Head Wo Contrast  07/17/2015   CLINICAL DATA:  Altered mental status. Very lethargic for the last few days. Right-sided weakness. History of stage IV lung cancer.  EXAM: CT HEAD WITHOUT CONTRAST  TECHNIQUE: Contiguous axial images were obtained from the base of the skull through the vertex without intravenous contrast.  COMPARISON:  05/05/2014  FINDINGS: There are multiple new brain lesions most consistent with metastases. A low-density/cystic lesion in the left parietal lobe measures 3.4 cm with mild surrounding vasogenic edema. There is a mildly hyperattenuating lesion in the superior left temporal lobe which measures approximately 2 cm with mild-to-moderate surrounding edema. A small mildly hyperattenuating lesion in the right frontal operculum measure 7 mm with mild edema. There may be an additional 1 cm lesion in the left centrum semiovale anterior to the larger cystic lesion. There is at most minimal mass effect on the left lateral ventricle without midline shift. No acute large territory infarct, acute intracranial hemorrhage, or extra-axial fluid collection is seen. Incidental note is made of a cavum septum pellucidum et vergae.  Prior bilateral cataract extraction is noted. Visualized paranasal sinuses and mastoid air cells are clear.  No suspicious skull lesions are identified.  IMPRESSION: At least 3-4 new brain lesions with mild-to-moderate associated edema, consistent with metastases. No significant mass effect. Recommend further evaluation with brain MRI.   Electronically Signed   By: Logan Bores M.D.   On: 07/17/2015 16:56   Mr Jeri Cos WU Contrast  07/22/2015   CLINICAL DATA:  Small cell lung cancer status post chemo radiation. Restaging.  EXAM: MRI HEAD WITHOUT AND WITH CONTRAST  TECHNIQUE: Multiplanar, multiecho pulse sequences of the brain and surrounding structures were obtained without and with intravenous contrast.  CONTRAST:  Contrast dose currently not available.  Reference EPIC.  COMPARISON:  03/26/2014  FINDINGS: Calvarium and upper cervical spine: No evidence of osseous metastasis.  Orbits: Cataract resections.  No evidence of metastatic spread  Sinuses and Mastoids: Subtotal bilateral  mastoid effusion, chronic and stable based on 2015 study. Clear nasopharynx. Clear paranasal sinuses.  Brain: Known cerebral metastases based on head CT 5 days ago. Measurements and image numbers are in the axial plane on series 10:  1. Right frontal lobe on image 109 measuring up to 22 mm. 2. Subcortical posterior right frontal lobe on image 125 measuring 8 mm diameter. 3. Inferior left temporal lobe on image 66 measuring 28 x 19 mm. 4. Touching but distinct left temporal lobe lesion on image 82 measuring 28 by 20 mm. 5. Left occipital lobe lesion, the largest, on image 90 measuring 29 x 38 mm. 6. Left parietal white matter on image 118 measuring 15 mm diameter. The metastases have variable degrees of surrounding vasogenic edema with local mass effect. No herniation or ventricular obstruction. The masses have variable degrees of cavitation, greatest in the occipital lobe mass. Blood products are seen on gradient imaging and previous head CT, greatest in the left temporal region.  No incidental infarct, new hematoma, hydrocephalus, or major  vessel occlusion.  IMPRESSION: 1. 6 cerebral metastases as described above, the largest in the left occipital lobe measuring 29 x 38 mm. 2. Variable vasogenic edema with local mass effect but no shift. 3. Metastases are cystic/hemorrhagic. No significant hemorrhage since head CT 07/17/2015.   Electronically Signed   By: Monte Fantasia M.D.   On: 07/22/2015 15:31    Impression/Plan:  .After review of his MRI and outside records, I think that [place] is the best form of salvage for his 6 lesions. He has already had WBRT in the form of PCI.  I think SRS in 3-5 fractions will provide better local control with less morbidity than repeat WBRT.  He and his wife are enthusiastic to proceed with Simulation today.  Consent signed today.  The understand there are risks related to [facility] including but not necessarily limited today - HAs, nausea, brain injury.  Will address HAs with Dexamethasone '8mg'$  TID and Oxycodone 5-10 mg q 4hrs prn. Refills given.  Will notify med/onc of plan.  I spent 35 minutes face to face with the patient and more than 50% of that time was spent in counseling and/or coordination of care. _____________________________________   Eppie Gibson, MD

## 2015-07-26 ENCOUNTER — Other Ambulatory Visit: Payer: Medicaid Other

## 2015-07-26 ENCOUNTER — Other Ambulatory Visit: Payer: Self-pay | Admitting: Radiation Oncology

## 2015-07-26 DIAGNOSIS — C7931 Secondary malignant neoplasm of brain: Secondary | ICD-10-CM

## 2015-07-26 MED ORDER — GADOBENATE DIMEGLUMINE 529 MG/ML IV SOLN
20.0000 mL | Freq: Once | INTRAVENOUS | Status: AC | PRN
  Administered 2015-07-22: 20 mL via INTRAVENOUS

## 2015-07-27 ENCOUNTER — Ambulatory Visit (HOSPITAL_COMMUNITY): Admission: RE | Admit: 2015-07-27 | Payer: Medicaid Other | Source: Ambulatory Visit

## 2015-07-27 ENCOUNTER — Other Ambulatory Visit: Payer: Self-pay | Admitting: Radiation Oncology

## 2015-07-27 ENCOUNTER — Ambulatory Visit: Payer: Self-pay | Admitting: Radiation Oncology

## 2015-07-28 ENCOUNTER — Ambulatory Visit (INDEPENDENT_AMBULATORY_CARE_PROVIDER_SITE_OTHER): Payer: Medicaid Other | Admitting: Internal Medicine

## 2015-07-28 ENCOUNTER — Encounter: Payer: Self-pay | Admitting: Internal Medicine

## 2015-07-28 ENCOUNTER — Ambulatory Visit (HOSPITAL_COMMUNITY)
Admission: RE | Admit: 2015-07-28 | Discharge: 2015-07-28 | Disposition: A | Discharge status: Home / Self Care | Payer: Medicaid Other | Source: Ambulatory Visit | Attending: Radiation Oncology | Admitting: Radiation Oncology

## 2015-07-28 VITALS — BP 120/80 | HR 118 | Ht 72.0 in | Wt 202.8 lb

## 2015-07-28 DIAGNOSIS — G4734 Idiopathic sleep related nonobstructive alveolar hypoventilation: Secondary | ICD-10-CM | POA: Diagnosis not present

## 2015-07-28 DIAGNOSIS — Z720 Tobacco use: Secondary | ICD-10-CM | POA: Diagnosis not present

## 2015-07-28 DIAGNOSIS — C7931 Secondary malignant neoplasm of brain: Secondary | ICD-10-CM

## 2015-07-28 DIAGNOSIS — R0600 Dyspnea, unspecified: Secondary | ICD-10-CM

## 2015-07-28 DIAGNOSIS — C801 Malignant (primary) neoplasm, unspecified: Secondary | ICD-10-CM | POA: Insufficient documentation

## 2015-07-28 DIAGNOSIS — F17210 Nicotine dependence, cigarettes, uncomplicated: Secondary | ICD-10-CM

## 2015-07-28 NOTE — Patient Instructions (Signed)
We would be happy to certify you for 02 through [facility] if they won't take the prescription from [facility] but this would require an overnight 02 study off all oxygen  Pulmonary follow up is as needed

## 2015-07-28 NOTE — Progress Notes (Signed)
Subjective:    Patient ID: Glen Mendez, male    DOB: [birth year],    MRN: [id number]  HPI  62 yowm active smoker with dx of small cell ca to cns summarized by Dr Earlie Server:  On 08/15/2014 the patient underwent bronchoscopy and the final pathology case number [id number] showed small cell undifferentiated carcinoma. The patient was seen by Dr. Daphene Jaeger. He underwent a course of concurrent chemoradiation with cisplatin and etoposide for total of 4 cycles completed 11/11/2014. The patient also underwent a prophylactic cranial irradiation completed on 01/03/2015.    07/28/2015 1st [facility] office visit/ [facility]   Chief Complaint  Patient presents with  . Pulmonary Consult    Refered by Dr. Billey Chang. Pt has hx of Lung CA. He uses o2 with sleep and needs recertification for this. He c/o DOE with walking up stairs.   breathing was better p initial rx  but gradually worse x 6 month esp up steps / at time of first of doe =  MMRC1 = can walk nl pace, flat grade, can't hurry or go uphills or steps s sob . Using noct 3lpm and sleeps fine, wants to change to advanced for 02 rx but it was ordered in [place]. Breathing better p saba. Assoc mostly dry daytime cough.  No obvious   day to day or daytime variabilty or assoc  cp or chest tightness, subjective wheeze overt sinus or hb symptoms. No unusual exp hx or h/o childhood pna/ asthma or knowledge of premature birth.  Sleeping ok without nocturnal  or early am exacerbation  of respiratory  c/o's or need for noct saba. Also denies any obvious fluctuation of symptoms with weather or environmental changes or other aggravating or alleviating factors except as outlined above   Current Medications, Allergies, Complete Past Medical History, Past Surgical History, Family History, and Social History were reviewed in [employer] record.            Review of Systems  Constitutional: Positive for appetite change. Negative  for fever, chills, activity change and unexpected weight change.  HENT: Positive for congestion and dental problem. Negative for postnasal drip, rhinorrhea, sneezing, sore throat, trouble swallowing and voice change.   Eyes: Negative for visual disturbance.  Respiratory: Positive for cough and shortness of breath. Negative for choking.   Cardiovascular: Negative for chest pain and leg swelling.  Gastrointestinal: Negative for nausea, vomiting and abdominal pain.  Genitourinary: Negative for difficulty urinating.  Musculoskeletal: Positive for arthralgias.  Skin: Negative for rash.  Psychiatric/Behavioral: Negative for behavioral problems and confusion.       Objective:   Physical Exam  Chronically ill wm who minimizes his symptoms   Wt Readings from Last 3 Encounters:  07/28/15 202 lb 12.8 oz (91.989 kg)  07/25/15 202 lb 6.4 oz (91.808 kg)  07/17/15 217 lb 6 oz (98.6 kg)    Vital signs reviewed  HEENT: nl dentition, turbinates, and orophanx. Nl external ear canals without cough reflex   NECK :  without JVD/Nodes/TM/ nl carotid upstrokes bilaterally   LUNGS: no acc muscle use,  Decreased bs with a few rhonchi RUL and dullness min at R base with decreased bs there as well    CV:  RRR  no s3 or murmur or increase in P2, no edema   ABD:  soft and nontender with nl excursion in the supine position. No bruits or organomegaly, bowel sounds nl  MS:  warm without deformities, calf tenderness, cyanosis  or clubbing  SKIN: warm and dry without lesions    NEURO:  alert, approp, no deficits     I personally reviewed images and agree with radiology impression as follows:  CXR:  07/17/15  1. Mildly improved aeration of the right upper lobe, however extensive parenchymal and pleural opacity remains with evidence of a degree of volume loss. This presumably relates to patient's history of lung cancer, and comparison with any more recent outside imaging is suggested. 2. Small right  pleural effusion at the base. 3. Right jugular Port-A-Cath terminates over the mid right atrium.      Assessment & Plan:

## 2015-07-29 ENCOUNTER — Telehealth: Payer: Self-pay | Admitting: Medical Oncology

## 2015-07-29 ENCOUNTER — Encounter: Payer: Self-pay | Admitting: *Deleted

## 2015-07-29 ENCOUNTER — Other Ambulatory Visit: Payer: Self-pay | Admitting: Medical Oncology

## 2015-07-29 ENCOUNTER — Ambulatory Visit: Payer: Medicaid Other | Admitting: Radiation Oncology

## 2015-07-29 ENCOUNTER — Ambulatory Visit: Admission: RE | Admit: 2015-07-29 | Payer: Medicaid Other | Source: Ambulatory Visit | Admitting: Radiation Oncology

## 2015-07-29 NOTE — Progress Notes (Signed)
[facility] Psychosocial Distress Screening Clinical Social Work  Clinical Social Work was referred by distress screening protocol.  The patient scored a 10 on the Psychosocial Distress Thermometer which indicates severe distress. Clinical Social Worker phoned pt to assess for distress and other psychosocial needs. CSW had to leave [facility] compliant message to return CSW call. CSW will attempt to meet pt and wife at upcoming [facility] appts. Pt may benefit from Brain Tumor Support Group.   ONCBCN DISTRESS SCREENING 07/25/2015  Screening Type Initial Screening  Distress experienced in past week (1-10) 10  Emotional problem type Depression;Adjusting to illness;Nervousness/Anxiety  Spiritual/Religous concerns type Facing my mortality  Information Concerns Type Lack of info about complementary therapy choices  Physical Problem type Pain;Sleep/insomnia;Loss of appetitie;Constipation/diarrhea    Clinical Social Worker follow up needed: Yes.    If yes, follow up plan: See above Loren Racer, [place] Worker [place]  [facility] Phone: [phone] Fax: [phone]

## 2015-07-29 NOTE — Telephone Encounter (Addendum)
[name] left message stating pt needs MRI- 'we need to all be on the same page. Why is pt being referred to Dr Melvyn Novas?" I called back and talked to pt and told him to contact Dr Isidore Moos regarding MRI question and that  Dr Billey Chang referred pt to Dr Melvyn Novas. He recognized Dr Tamela Oddi name. I also left this message on this phone earlier in case [name] checks it.Marland Kitchen

## 2015-07-30 ENCOUNTER — Encounter: Payer: Self-pay | Admitting: Internal Medicine

## 2015-07-30 DIAGNOSIS — G4734 Idiopathic sleep related nonobstructive alveolar hypoventilation: Secondary | ICD-10-CM | POA: Insufficient documentation

## 2015-07-30 DIAGNOSIS — F17210 Nicotine dependence, cigarettes, uncomplicated: Secondary | ICD-10-CM | POA: Insufficient documentation

## 2015-07-30 DIAGNOSIS — R0600 Dyspnea, unspecified: Secondary | ICD-10-CM | POA: Insufficient documentation

## 2015-07-30 NOTE — Assessment & Plan Note (Addendum)
DDX of  difficult airways management all start with A and  include Adherence, Ace Inhibitors, Acid Reflux, Active Sinus Disease, Alpha 1 Antitripsin deficiency, Anxiety masquerading as Airways dz,  ABPA,  allergy(esp in young), Aspiration (esp in elderly), Adverse effects of meds,  Active smokers, A bunch of PE's (a small clot burden can't cause this syndrome unless there is already severe underlying pulm or vascular dz with poor reserve) plus two Bs  = Bronchiectasis and Beta blocker use..and one C= CHF  Adherence is always the initial "prime suspect" and is a multilayered concern that requires a "trust but verify" approach in every patient - starting with knowing how to use medications, especially inhalers, correctly, keeping up with refills and understanding the fundamental difference between maintenance and prns vs those medications only taken for a very short course and then stopped and not refilled.   Active smoking biggest concern > see sep a/p  ? Allergy/asthma > strongly doubt as he's on Dex for CNS mets and saba/sama rx reviewed> adequate for now  ? Acid (or non-acid) GERD > always difficult to exclude as up to 75% of pts in some series report no assoc GI/ Heartburn symptoms> rec continue max (24h)  acid suppression and diet restrictions/ reviewed     ? Anxiety .> already on clonepin  Plus one more A = An obst of RUL > for f/u CT by Dr Earlie Server in Oct and if desired we could certainly have another look but advised the RUL is not an important contributor to ventilatory reserve.   Total time = 57mreview case with pt/wife discussion/ counseling/ giving and going over instructions (see avs)

## 2015-07-30 NOTE — Assessment & Plan Note (Signed)
rx in [place] since ? Oct 2015   No need for daytime 02 by our eval today and would need repeat ono RA for [place] to complete the paperwork so best alternative is for his doctor in [place] wto send it directly to his new DME company in [place]

## 2015-07-30 NOTE — Assessment & Plan Note (Signed)

## 2015-08-01 ENCOUNTER — Ambulatory Visit: Payer: Medicaid Other | Admitting: Radiation Oncology

## 2015-08-01 ENCOUNTER — Ambulatory Visit: Admission: RE | Admit: 2015-08-01 | Payer: Medicaid Other | Source: Ambulatory Visit | Admitting: Radiation Oncology

## 2015-08-01 DIAGNOSIS — Z510 Encounter for antineoplastic radiation therapy: Secondary | ICD-10-CM | POA: Diagnosis not present

## 2015-08-03 ENCOUNTER — Ambulatory Visit
Admission: RE | Admit: 2015-08-03 | Discharge: 2015-08-03 | Disposition: A | Discharge status: Home / Self Care | Payer: Medicaid Other | Source: Ambulatory Visit | Attending: Radiation Oncology | Admitting: Radiation Oncology

## 2015-08-03 ENCOUNTER — Ambulatory Visit (HOSPITAL_BASED_OUTPATIENT_CLINIC_OR_DEPARTMENT_OTHER): Payer: Medicaid Other

## 2015-08-03 ENCOUNTER — Encounter: Payer: Self-pay | Admitting: Radiation Oncology

## 2015-08-03 VITALS — BP 121/81 | HR 93 | Temp 98.0°F | Resp 16

## 2015-08-03 DIAGNOSIS — C7931 Secondary malignant neoplasm of brain: Secondary | ICD-10-CM

## 2015-08-03 DIAGNOSIS — Z95828 Presence of other vascular implants and grafts: Secondary | ICD-10-CM

## 2015-08-03 DIAGNOSIS — Z452 Encounter for adjustment and management of vascular access device: Secondary | ICD-10-CM

## 2015-08-03 DIAGNOSIS — Z510 Encounter for antineoplastic radiation therapy: Secondary | ICD-10-CM | POA: Diagnosis not present

## 2015-08-03 DIAGNOSIS — C3491 Malignant neoplasm of unspecified part of right bronchus or lung: Secondary | ICD-10-CM

## 2015-08-03 MED ORDER — FLUCONAZOLE 100 MG PO TABS: ORAL_TABLET | ORAL | Status: DC

## 2015-08-03 MED ORDER — DEXAMETHASONE 4 MG PO TABS: ORAL_TABLET | ORAL | Status: DC

## 2015-08-03 MED ORDER — OXYCODONE HCL 10 MG PO TABS: ORAL_TABLET | ORAL | Status: DC

## 2015-08-03 MED ORDER — SODIUM CHLORIDE 0.9 % IJ SOLN
10.0000 mL | INTRAMUSCULAR | Status: DC | PRN
Start: 2015-08-03 — End: 2015-08-03
  Administered 2015-08-03: 10 mL via INTRAVENOUS
  Filled 2015-08-03: qty 10

## 2015-08-03 MED ORDER — HEPARIN SOD (PORK) LOCK FLUSH 100 UNIT/ML IV SOLN
500.0000 [IU] | Freq: Once | INTRAVENOUS | Status: AC
  Administered 2015-08-03: 500 [IU] via INTRAVENOUS
  Filled 2015-08-03: qty 5

## 2015-08-03 NOTE — Progress Notes (Signed)
  Radiation Oncology         [phone] ________________________________  Stereotactic Treatment Procedure Note OUTPATIENT    ICD-9-CM ICD-10-CM   1. Brain metastases 198.3 C79.31 dexamethasone (DECADRON) 4 MG tablet     oxyCODONE 10 MG TABS     fluconazole (DIFLUCAN) 100 MG tablet     Name: Glen Mendez MRN: [id number]  Date: 08/03/2015  DOB: January 18, 1966  SPECIAL TREATMENT PROCEDURE  3D TREATMENT PLANNING AND DOSIMETRY:  The patient's radiation plan was reviewed and approved by neurosurgery and radiation oncology prior to treatment.  It showed 3-dimensional radiation distributions overlaid onto the planning CT/MRI image set.  The [facility] for the target structures as well as the organs at risk were reviewed. The documentation of the 3D plan and dosimetry are filed in the radiation oncology EMR.  NARRATIVE:  Glen Mendez was brought to the TrueBeam stereotactic radiation treatment machine and placed supine on the CT couch. The head frame was applied, and the patient was set up for stereotactic radiosurgery.  Neurosurgery was present for the set-up and delivery  SIMULATION VERIFICATION:  In the couch zero-angle position, the patient underwent Exactrac imaging using the Brainlab system with orthogonal KV images.  These were carefully aligned and repeated to confirm treatment position for each of the isocenters.  The Exactrac snap film verification was repeated at each couch angle.  SPECIAL TREATMENT PROCEDURE: Glen Mendez received stereotactic radiosurgery to the following targets:  Left Occipital 38 mm, Left Superior Temporal 31 mm, Left Inferior Temporal 29 mm, Right Frontal 61m, Left Parietal 1828m Right Posterior Frontal 28m29margets were  treated using 10 Rapid Arc VMAT Beams to a prescription dose of 7 Gy.  ExacTrac Snap verification was performed for each couch angle.   This constitutes a special treatment procedure due to the ablative dose delivered and the technical nature of  treatment.  This highly technical modality of treatment ensures that the ablative dose is centered on the patient's tumor while sparing normal tissues from excessive dose and risk of detrimental effects.  STEREOTACTIC TREATMENT MANAGEMENT:  Following delivery, the patient was transported to nursing in stable condition and monitored for possible acute effects.  Vital signs were recorded BP 121/81 mmHg  Pulse 93  Temp(Src) 98 F (36.7 C) (Oral)  Resp 16  SpO2 100%.   I spoke with the patient about continued HAs.  Increased dose of oxycodone, refilled dexamethasone.  He had thrush on exam. Rx'd fluconazole.  The patient tolerated treatment without significant acute effects, and was discharged to home in stable condition.    PLAN:  Followup in 2 days for next fraction. ________________________________   SarEppie GibsonD

## 2015-08-03 NOTE — Progress Notes (Signed)
Glen Mendez here for post srs monitoring.  He reports he has a headache that he is rating at a 10/10.  He reports he had the headache before his SRS.  He denies nausea, dizziness and vision changes.

## 2015-08-03 NOTE — Patient Instructions (Signed)

## 2015-08-05 ENCOUNTER — Ambulatory Visit
Admission: RE | Admit: 2015-08-05 | Discharge: 2015-08-05 | Disposition: A | Discharge status: Home / Self Care | Payer: Medicaid Other | Source: Ambulatory Visit | Attending: Radiation Oncology | Admitting: Radiation Oncology

## 2015-08-05 ENCOUNTER — Encounter: Payer: Self-pay | Admitting: Radiation Oncology

## 2015-08-05 VITALS — BP 114/73 | HR 90 | Temp 97.7°F | Resp 20

## 2015-08-05 DIAGNOSIS — C7931 Secondary malignant neoplasm of brain: Secondary | ICD-10-CM

## 2015-08-05 DIAGNOSIS — Z510 Encounter for antineoplastic radiation therapy: Secondary | ICD-10-CM | POA: Diagnosis not present

## 2015-08-05 NOTE — Progress Notes (Signed)
patient ambulated to room 12 after [place]  [facility],  with therapist Altha Harm and wife, no c/o pain nausea, h/a or dizzy ness, no vision changes, appetite better, taking decadron qid on medicatio for thrush, no apapetite good, monitor for 30 minutes 2nd set vitals wnl, no c/o, instructions given to call for any unusual symptoms not normal for patient, no driving today, rest when goes home 2:03 PM

## 2015-08-08 ENCOUNTER — Ambulatory Visit
Admission: RE | Admit: 2015-08-08 | Discharge: 2015-08-08 | Disposition: A | Discharge status: Home / Self Care | Payer: Medicaid Other | Source: Ambulatory Visit | Attending: Radiation Oncology | Admitting: Radiation Oncology

## 2015-08-08 ENCOUNTER — Encounter: Payer: Self-pay | Admitting: Radiation Oncology

## 2015-08-08 VITALS — BP 151/81 | HR 98 | Temp 97.8°F | Resp 20

## 2015-08-08 DIAGNOSIS — C7931 Secondary malignant neoplasm of brain: Secondary | ICD-10-CM

## 2015-08-08 DIAGNOSIS — Z510 Encounter for antineoplastic radiation therapy: Secondary | ICD-10-CM | POA: Diagnosis not present

## 2015-08-08 MED ORDER — OXYCODONE HCL 10 MG PO TABS: ORAL_TABLET | ORAL | Status: DC

## 2015-08-08 MED ORDER — DEXAMETHASONE 4 MG PO TABS: ORAL_TABLET | ORAL | Status: DC

## 2015-08-08 NOTE — Progress Notes (Signed)
  Radiation Oncology         [phone] ________________________________  Stereotactic Treatment Procedure Note OUTPATIENT    ICD-9-CM ICD-10-CM   1. Brain metastases ([place]) 198.3 C79.31 Oxycodone HCl 10 MG TABS     dexamethasone (DECADRON) 4 MG tablet     Name: Glen Mendez MRN: [id number]  Date: 08/08/2015  DOB: 01-10-1966  SPECIAL TREATMENT PROCEDURE Final (3rd) fraction  3D TREATMENT PLANNING AND DOSIMETRY:  The patient's radiation plan was reviewed and approved by neurosurgery and radiation oncology prior to treatment.  It showed 3-dimensional radiation distributions overlaid onto the planning CT/MRI image set.  The [facility] for the target structures as well as the organs at risk were reviewed. The documentation of the 3D plan and dosimetry are filed in the radiation oncology EMR.  NARRATIVE:  Glen Mendez was brought to the TrueBeam stereotactic radiation treatment machine and placed supine on the CT couch. The head frame was applied, and the patient was set up for stereotactic radiosurgery.  Neurosurgery was present for the set-up and delivery  SIMULATION VERIFICATION:  In the couch zero-angle position, the patient underwent Exactrac imaging using the Brainlab system with orthogonal KV images.  These were carefully aligned and repeated to confirm treatment position for each of the isocenters.  The Exactrac snap film verification was repeated at each couch angle.  SPECIAL TREATMENT PROCEDURE: Glen Mendez received stereotactic radiosurgery to the following targets:  Left Occipital 38 mm, Left Superior Temporal 31 mm, Left Inferior Temporal 29 mm, Right Frontal 646m, Left Parietal 136m Right Posterior Frontal 46m52margets were  treated using 10 Rapid Arc VMAT Beams to a prescription dose of 7 Gy.  ExacTrac Snap verification was performed for each couch angle.   This constitutes a special treatment procedure due to the ablative dose delivered and the technical nature of treatment.   This highly technical modality of treatment ensures that the ablative dose is centered on the patient's tumor while sparing normal tissues from excessive dose and risk of detrimental effects.  STEREOTACTIC TREATMENT MANAGEMENT:  Following delivery, the patient was transported to nursing in stable condition and monitored for possible acute effects.  Vital signs were recorded BP 151/81 mmHg  Pulse 98  Temp(Src) 97.8 F (36.6 C) (Oral)  Resp 20  SpO2 100%.  No thrush today on exam.  I spoke with the patient about   Dexamethasone taper  On Oct 13th, taper to 1 tablet three times a day  On Oct 27th, taper to 1 tablet twice a day  On Nov 10th, taper to 1 tablet at breakfast only  On Nov 24th, taper to 1/2 tablet at breakfast only  On Dec 1st, taper to 1/2 tablet every other day.  Last dose: Dec 8th.  Refilled Oxycodone.  The patient tolerated treatment without significant acute effects, and was discharged to home in stable condition.    PLAN:  Followup in 1 mo  ________________________________   SarEppie GibsonD

## 2015-08-08 NOTE — Progress Notes (Addendum)
SRS #3, brain, has pain 8/10 pain in head, time to take his pain med again, has it in the car will take when he leaves, dizzynes when getting up in am if gets up too fast, no nuasea, no vision changes stated, monitor  30 minutes, MD to see before he leaves tapering dexamethasone,  Taking 2 tabs '8mg'$  every 4 hours, while awake, doesn't take at night ,keeps him awake, no thrush seen on tongue, mouth moistened, appetite great 1:49 PM

## 2015-08-08 NOTE — Patient Instructions (Signed)
Dexamethasone taper  On Oct 13th, taper to 1 tablet three times a day  On Oct 27th, taper to 1 tablet twice a day  On Nov 10th, taper to 1 tablet at breakfast only  On Nov 24th, taper to 1/2 tablet at breakfast only  On Dec 1st, taper to 1/2 tablet every other day.  Last dose: Dec 8th.

## 2015-08-09 NOTE — Op Note (Signed)
  Name: Glen Mendez  MRN: [id number]  Date: 08/03/2015   DOB: 07-26-1966  Stereotactic Radiosurgery Operative Note  PRE-OPERATIVE DIAGNOSIS:  Multiple Brain Metastases  POST-OPERATIVE DIAGNOSIS:  Multiple Brain Metastases  PROCEDURE:  Stereotactic Radiosurgery  SURGEON:  Jairo Ben, MD  RADIATION ONCOLOGIST: Dr. Eppie Gibson, MD  NARRATIVE: The patient underwent a radiation treatment planning session in the radiation oncology simulation suite under the care of the radiation oncology physician and physicist.  I participated closely in the radiation treatment planning afterwards. The patient underwent planning CT which was fused to 3T high resolution MRI with 1 mm axial slices.  These images were fused on the planning system.  We contoured the gross target volumes and subsequently expanded this to yield the Planning Target Volume. I actively participated in the planning process.  I helped to define and review the target contours and also the contours of the optic pathway, eyes, brainstem and selected nearby organs at risk.  All the dose constraints for critical structures were reviewed and compared to AAPM Task Group 101.  The prescription dose conformity was reviewed.  I approved the plan electronically.    Accordingly, [name] was brought to the TrueBeam stereotactic radiation treatment linac and placed in the custom immobilization mask.  The patient was aligned according to the IR fiducial markers with BrainLab Exactrac, then orthogonal x-rays were used in ExacTrac with the 6DOF robotic table and the shifts were made to align the patient  [name] received stereotactic radiosurgery uneventfully.    Lesions treated:  6 , Complex lesions treated:  1 (>3.5 cm, <30m of optic path, or within the brainstem) each to 7Gy (first of 3 fractions for a planned total of 21Gy):  Left Occipital 38 mm,   Left Superior Temporal 31 mm,   Left Inferior Temporal 29 mm,   Right Frontal  267m   Left Parietal 1546m  Right Posterior Frontal 8mm59mThe detailed description of the procedure is recorded in the radiation oncology procedure note.  I was present for the duration of the procedure.  DISPOSITION:  Following delivery, the patient was transported to nursing in stable condition and monitored for possible acute effects to be discharged to home in stable condition with the remaining 2 fractions planned over the next week.  NUNDConsuella Lose [name] 08/09/2015 5:59 PM

## 2015-08-09 NOTE — Addendum Note (Signed)
Encounter addended by: [name], MD on: 08/09/2015  6:02 PM<BR>     Documentation filed: Notes Section

## 2015-08-10 ENCOUNTER — Ambulatory Visit: Admission: RE | Admit: 2015-08-10 | Payer: Medicaid Other | Source: Ambulatory Visit | Admitting: Radiation Oncology

## 2015-08-10 ENCOUNTER — Ambulatory Visit (HOSPITAL_COMMUNITY): Payer: Medicaid Other

## 2015-08-10 NOTE — Progress Notes (Signed)
  Radiation Oncology         [phone] ________________________________  Name: Glen Mendez MRN: [id number]  Date: 08/08/2015  DOB: 19-Sep-1966  End of Treatment Note  Diagnosis: Brain metastases, small cell lung cancer, C79.31  Indication for treatment: Palliative, salvage   Radiation treatment dates: 08/03/2015, 08/05/2015, 08/08/2015  Site/dose: Brain tumors, 21 Gy in 7 fractions  Beams/energy: SBRT/SRT-VMAT, 6FFF  Left Occipital 38 mm, Left Superior Temporal 31 mm, Left Inferior Temporal 29 mm, Right Frontal 24m, Left Parietal 144m Right Posterior Frontal 62m4margets were  treated using 10 Rapid Arc VMAT Beams  Narrative: The patient tolerated radiation treatment relatively well.    Plan: The patient has completed radiation treatment. The patient will return to radiation oncology clinic for routine followup in one month. I advised them to call or return sooner if they have any questions or concerns related to their recovery or treatment.  Dexamethasone taper  On Oct 13th, taper to 1 tablet three times a day  On Oct 27th, taper to 1 tablet twice a day  On Nov 10th, taper to 1 tablet at breakfast only  On Nov 24th, taper to 1/2 tablet at breakfast only  On Dec 1st, taper to 1/2 tablet every other day.  Last dose: Dec 8th.  -----------------------------------  SarEppie GibsonD

## 2015-08-10 NOTE — Addendum Note (Signed)
Encounter addended by: Eppie Gibson, MD on: 08/10/2015 11:11 AM<BR>     Documentation filed: Notes Section

## 2015-08-11 ENCOUNTER — Other Ambulatory Visit: Payer: Medicaid Other

## 2015-08-15 ENCOUNTER — Ambulatory Visit (HOSPITAL_COMMUNITY)
Admission: RE | Admit: 2015-08-15 | Discharge: 2015-08-15 | Disposition: A | Discharge status: Home / Self Care | Payer: Medicaid Other | Source: Ambulatory Visit | Attending: Internal Medicine | Admitting: Internal Medicine

## 2015-08-15 DIAGNOSIS — C3491 Malignant neoplasm of unspecified part of right bronchus or lung: Secondary | ICD-10-CM

## 2015-08-17 ENCOUNTER — Other Ambulatory Visit (HOSPITAL_COMMUNITY)
Admission: AD | Admit: 2015-08-17 | Discharge: 2015-08-17 | Disposition: A | Discharge status: Home / Self Care | Payer: Medicaid Other | Source: Ambulatory Visit | Attending: Nurse Practitioner | Admitting: Nurse Practitioner

## 2015-08-17 ENCOUNTER — Telehealth: Payer: Self-pay | Admitting: *Deleted

## 2015-08-17 ENCOUNTER — Other Ambulatory Visit: Payer: Self-pay | Admitting: Nurse Practitioner

## 2015-08-17 ENCOUNTER — Ambulatory Visit (HOSPITAL_BASED_OUTPATIENT_CLINIC_OR_DEPARTMENT_OTHER): Payer: Medicaid Other | Admitting: Nurse Practitioner

## 2015-08-17 ENCOUNTER — Other Ambulatory Visit (HOSPITAL_BASED_OUTPATIENT_CLINIC_OR_DEPARTMENT_OTHER): Payer: Medicaid Other

## 2015-08-17 ENCOUNTER — Encounter: Payer: Self-pay | Admitting: Nurse Practitioner

## 2015-08-17 VITALS — BP 143/85 | HR 102 | Resp 18 | Ht 72.0 in | Wt 215.8 lb

## 2015-08-17 DIAGNOSIS — D696 Thrombocytopenia, unspecified: Secondary | ICD-10-CM

## 2015-08-17 DIAGNOSIS — C7931 Secondary malignant neoplasm of brain: Secondary | ICD-10-CM

## 2015-08-17 DIAGNOSIS — R601 Generalized edema: Secondary | ICD-10-CM

## 2015-08-17 DIAGNOSIS — E46 Unspecified protein-calorie malnutrition: Secondary | ICD-10-CM | POA: Insufficient documentation

## 2015-08-17 DIAGNOSIS — C3491 Malignant neoplasm of unspecified part of right bronchus or lung: Secondary | ICD-10-CM

## 2015-08-17 DIAGNOSIS — T300 Burn of unspecified body region, unspecified degree: Secondary | ICD-10-CM

## 2015-08-17 DIAGNOSIS — C3490 Malignant neoplasm of unspecified part of unspecified bronchus or lung: Secondary | ICD-10-CM

## 2015-08-17 DIAGNOSIS — T2022XA Burn of second degree of lip(s), initial encounter: Secondary | ICD-10-CM

## 2015-08-17 DIAGNOSIS — R609 Edema, unspecified: Secondary | ICD-10-CM | POA: Insufficient documentation

## 2015-08-17 DIAGNOSIS — R0602 Shortness of breath: Secondary | ICD-10-CM

## 2015-08-17 DIAGNOSIS — E8809 Other disorders of plasma-protein metabolism, not elsewhere classified: Secondary | ICD-10-CM | POA: Insufficient documentation

## 2015-08-17 DIAGNOSIS — R740 Nonspecific elevation of levels of transaminase and lactic acid dehydrogenase [LDH]: Secondary | ICD-10-CM

## 2015-08-17 DIAGNOSIS — R748 Abnormal levels of other serum enzymes: Secondary | ICD-10-CM | POA: Diagnosis not present

## 2015-08-17 DIAGNOSIS — E860 Dehydration: Secondary | ICD-10-CM | POA: Insufficient documentation

## 2015-08-17 DIAGNOSIS — R7401 Elevation of levels of liver transaminase levels: Secondary | ICD-10-CM | POA: Insufficient documentation

## 2015-08-17 LAB — COMPREHENSIVE METABOLIC PANEL (CC13)
ALBUMIN: 2.9 g/dL — AB (ref 3.5–5.0)
ALK PHOS: 67 U/L (ref 40–150)
ALT: 60 U/L — AB (ref 0–55)
AST: 40 U/L — ABNORMAL HIGH (ref 5–34)
Anion Gap: 11 mEq/L (ref 3–11)
BILIRUBIN TOTAL: 0.59 mg/dL (ref 0.20–1.20)
BUN: 23.9 mg/dL (ref 7.0–26.0)
CO2: 30 mEq/L — ABNORMAL HIGH (ref 22–29)
CREATININE: 1.3 mg/dL (ref 0.7–1.3)
Calcium: 8.2 mg/dL — ABNORMAL LOW (ref 8.4–10.4)
Chloride: 89 mEq/L — ABNORMAL LOW (ref 98–109)
EGFR: 66 mL/min/{1.73_m2} — AB (ref >90)
GLUCOSE: 101 mg/dL (ref 70–140)
Potassium: 3.6 mEq/L (ref 3.5–5.1)
Sodium: 130 mEq/L — ABNORMAL LOW (ref 136–145)
TOTAL PROTEIN: 5.4 g/dL — AB (ref 6.4–8.3)

## 2015-08-17 LAB — CBC WITH DIFFERENTIAL/PLATELET
BASO%: 0.1 % (ref 0.0–2.0)
Basophils Absolute: 0 10*3/uL (ref 0.0–0.1)
EOS ABS: 0 10*3/uL (ref 0.0–0.5)
EOS%: 0.2 % (ref 0.0–7.0)
HEMATOCRIT: 35.9 % — AB (ref 38.4–49.9)
HEMOGLOBIN: 12.4 g/dL — AB (ref 13.0–17.1)
LYMPH#: 0.2 10*3/uL — AB (ref 0.9–3.3)
LYMPH%: 3.3 % — AB (ref 14.0–49.0)
MCH: 34.2 pg — ABNORMAL HIGH (ref 27.2–33.4)
MCHC: 34.6 g/dL (ref 32.0–36.0)
MCV: 98.9 fL — AB (ref 79.3–98.0)
MONO#: 0.1 10*3/uL (ref 0.1–0.9)
MONO%: 2.9 % (ref 0.0–14.0)
NEUT%: 93.5 % — ABNORMAL HIGH (ref 39.0–75.0)
NEUTROS ABS: 4.8 10*3/uL (ref 1.5–6.5)
PLATELETS: 50 10*3/uL — AB (ref 140–400)
RBC: 3.63 10*6/uL — AB (ref 4.20–5.82)
RDW: 16.8 % — AB (ref 11.0–14.6)
WBC: 5.1 10*3/uL (ref 4.0–10.3)

## 2015-08-17 LAB — BRAIN NATRIURETIC PEPTIDE: B Natriuretic Peptide: 75.6 pg/mL (ref 0.0–100.0)

## 2015-08-17 MED ORDER — FUROSEMIDE 20 MG PO TABS
40.0000 mg | ORAL_TABLET | Freq: Two times a day (BID) | ORAL | Status: DC
Start: 2015-08-17 — End: 2015-08-29

## 2015-08-17 NOTE — Assessment & Plan Note (Signed)
Despite patient's bilateral lower extremity edema-patient is dehydrated with sodium low at 1:30 and chloride at 89.  Patient was encouraged to push fluids moderately.
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
## 2015-08-17 NOTE — Assessment & Plan Note (Addendum)
Patient reports increasing lower extremity peripheral edema for the past 4-5 days.  He states that he has scratched at his left lower leg; it is now sleeping at times.  He does complain of some mild increase shortness of breath; but no chest pain, chest pressure, or pain with inspiration.  On exam patient has coarse rales throughout his bilateral lung fields.  There is no obvious wheezing or cough.  There is also no obvious shortness of breath or acute respiratory distress on exam.  Patient's bilateral lower extremities with +3-4 pitting edema.  Patient appears to have scratched at his left lower lateral leg; and that area is occasionally weeping serous fluid.  Labs obtained today reveal a WBC of 5.1, ANC 4.8, hemoglobin 12.4, platelet count 50.  Labs show the patient is dehydrated with hyponatremia.  BNP was normal at 75.6 today.  Advised both patient and his family that the peripheral edema is most likely secondary to acute hypoalbuminemia; since his albumen level is at 2.9 now.  Patient was encouraged to push protein in his diet is much as possible; and to elevate his legs above the level of his heart when possible.  He was also encouraged to remain as active as possible.  Per Dr. Hale Drone prescribed Lasix 20 mg twice daily for a total of 5 days to see if this helps.

## 2015-08-17 NOTE — Assessment & Plan Note (Signed)
Patient completed his brain irradiation on 08/10/2015.  He is currently undergoing observation only.  Patient presented to the [facility] today with complaint of increased lower extremity peripheral edema.  He also states that he has become slightly more short of breath recently.  He has nebulizers to use at home; but the tubing connector is broken and makes it difficult for him to use.  He still tries to use the nebulizers every 4-6 hours.  He also has home oxygen at home; but states that he does not need it at this time.  Patient was scheduled to return tomorrow for a restaging CT of the chest.; but now will include a CT with contrast of the abdomen and pelvis as well due to increasing liver enzymes.  Patient plans to meet with Dr. Julien Nordmann tomorrow following the scan to review results.
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
## 2015-08-17 NOTE — Assessment & Plan Note (Signed)
Platelet count down to 50 today.  No evidence of active bleeding; other than the one area to patient's left lower leg where he has been scratching.  Patient was advised to monitor for any easy bleeding or bruising.  Will continue to monitor closely.

## 2015-08-17 NOTE — Telephone Encounter (Signed)
Pt called radonc with complaints of edema in all extremities. Pt wife gave him a dose of her own furosemide and it did not seem to help pt swelling. Anderson Malta asked to have pt seen in [facility] today. Asked to have pt here at 1230 for labs and [facility] at 1pm.

## 2015-08-17 NOTE — Assessment & Plan Note (Signed)
Patient is experiencing significant bilateral lower extremity peripheral edema within the past week.  BNP was normal.  Albumen down to 2.9.  Previously albumen was 3.5.  Most likely, peripheral edema secondary to hypoalbuminemia.  Patient was encouraged to push protein in his diet.  He was also encouraged to elevate his legs above the level of his heart and remain active.

## 2015-08-17 NOTE — Assessment & Plan Note (Signed)
Liver enzymes have increased since his last blood draw with AST increased from 13 up to 40 and ALT increased from 9 up to 60.  Patient denies any abdominal discomfort whatsoever.  He denies any other GI symptoms.  Will add a CT with contrast of the abdomen and pelvis to planned restaging CT of the chest are a scheduled for tomorrow.

## 2015-08-18 ENCOUNTER — Ambulatory Visit (HOSPITAL_COMMUNITY)
Admission: RE | Admit: 2015-08-18 | Discharge: 2015-08-18 | Disposition: A | Discharge status: Home / Self Care | Payer: Medicaid Other | Source: Ambulatory Visit | Attending: Nurse Practitioner | Admitting: Nurse Practitioner

## 2015-08-18 ENCOUNTER — Telehealth: Payer: Self-pay | Admitting: Internal Medicine

## 2015-08-18 ENCOUNTER — Encounter (HOSPITAL_COMMUNITY): Payer: Self-pay

## 2015-08-18 ENCOUNTER — Ambulatory Visit (HOSPITAL_BASED_OUTPATIENT_CLINIC_OR_DEPARTMENT_OTHER): Payer: Medicaid Other | Admitting: Internal Medicine

## 2015-08-18 ENCOUNTER — Ambulatory Visit (HOSPITAL_COMMUNITY)
Admission: RE | Admit: 2015-08-18 | Discharge: 2015-08-18 | Disposition: A | Discharge status: Home / Self Care | Payer: Medicaid Other | Source: Ambulatory Visit | Attending: Internal Medicine | Admitting: Internal Medicine

## 2015-08-18 ENCOUNTER — Encounter: Payer: Self-pay | Admitting: Internal Medicine

## 2015-08-18 VITALS — BP 134/80 | HR 112 | Temp 98.2°F | Resp 20 | Ht 72.0 in | Wt 213.0 lb

## 2015-08-18 DIAGNOSIS — J90 Pleural effusion, not elsewhere classified: Secondary | ICD-10-CM | POA: Insufficient documentation

## 2015-08-18 DIAGNOSIS — C3491 Malignant neoplasm of unspecified part of right bronchus or lung: Secondary | ICD-10-CM

## 2015-08-18 DIAGNOSIS — C7931 Secondary malignant neoplasm of brain: Secondary | ICD-10-CM | POA: Diagnosis not present

## 2015-08-18 DIAGNOSIS — I700 Atherosclerosis of aorta: Secondary | ICD-10-CM | POA: Diagnosis not present

## 2015-08-18 DIAGNOSIS — R911 Solitary pulmonary nodule: Secondary | ICD-10-CM | POA: Diagnosis not present

## 2015-08-18 DIAGNOSIS — N2889 Other specified disorders of kidney and ureter: Secondary | ICD-10-CM | POA: Diagnosis not present

## 2015-08-18 DIAGNOSIS — Z923 Personal history of irradiation: Secondary | ICD-10-CM | POA: Insufficient documentation

## 2015-08-18 DIAGNOSIS — R609 Edema, unspecified: Secondary | ICD-10-CM

## 2015-08-18 DIAGNOSIS — R918 Other nonspecific abnormal finding of lung field: Secondary | ICD-10-CM | POA: Diagnosis not present

## 2015-08-18 DIAGNOSIS — R0609 Other forms of dyspnea: Secondary | ICD-10-CM | POA: Diagnosis not present

## 2015-08-18 DIAGNOSIS — R600 Localized edema: Secondary | ICD-10-CM | POA: Diagnosis not present

## 2015-08-18 DIAGNOSIS — I313 Pericardial effusion (noninflammatory): Secondary | ICD-10-CM | POA: Insufficient documentation

## 2015-08-18 DIAGNOSIS — C3490 Malignant neoplasm of unspecified part of unspecified bronchus or lung: Secondary | ICD-10-CM

## 2015-08-18 DIAGNOSIS — N200 Calculus of kidney: Secondary | ICD-10-CM | POA: Diagnosis not present

## 2015-08-18 DIAGNOSIS — R0602 Shortness of breath: Secondary | ICD-10-CM | POA: Diagnosis not present

## 2015-08-18 MED ORDER — IOHEXOL 300 MG/ML  SOLN
75.0000 mL | Freq: Once | INTRAMUSCULAR | Status: AC | PRN
  Administered 2015-08-18: 75 mL via INTRAVENOUS

## 2015-08-18 NOTE — Telephone Encounter (Signed)
S.w. Pt wife and advised on OCT appt....ok and aware

## 2015-08-19 ENCOUNTER — Telehealth: Payer: Self-pay | Admitting: Internal Medicine

## 2015-08-19 NOTE — Progress Notes (Signed)
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  PRIOR THERAPY: 1) status post 4 cycles of systemic chemotherapy with cisplatin and etoposide followed by prophylactic cranial irradiation under the care of Dr. Andreas Ohm at [facility]. 2) status post salvage palliative radiotherapy to several brain lesions with the stereotactic radiotherapy under the care of Dr. Isidore Moos completed on 08/08/2015.  CURRENT THERAPY: None.  INTERVAL HISTORY: Glen Mendez 49 y.o. male returns to the clinic today for follow-up visit accompanied by his wife and daughter. The patient was recently diagnosed with multiple new metastatic brain lesions involving the left occipital, left superior temporal, left inferior temporal, right frontal, left parietal as well as right posterior frontal area. He received palliative stereotactic radiotherapy to these lesions under the care of Dr. Isidore Moos completed on 08/08/2015. The patient has been complaining of increasing fatigue and weakness as well as swelling of the lower extremities. He was seen by the symptom management nurse practitioner yesterday. We will increase the dose of his Lasix to 40 mg by mouth twice a day for 5 days in the swelling is much better today. He denied having any significant chest pain, shortness breath except with exertion, no cough or hemoptysis. The patient denied having any significant weight loss or night sweats. He had repeat CT scan of the chest, abdomen and pelvis performed earlier today and he is here for evaluation and discussion of his scan results.  MEDICAL HISTORY: Past Medical History  Diagnosis Date  . Lung cancer ([place])   . Bipolar disorder ([place])   . COPD (chronic obstructive  pulmonary disease) ([place])   . Hypertension   . Diabetes mellitus without complication ([place])   . Brain metastases ([place])   . Radiation     [facility], 10 days to brain, and 6 weeks to chest by Dr. Roxanne Mins    ALLERGIES:  is allergic to vicodin.  MEDICATIONS:  Current Outpatient Prescriptions  Medication Sig Dispense Refill  . albuterol (PROVENTIL) (5 MG/ML) 0.5% nebulizer solution Take 2.5 mg by nebulization every 4 (four) hours as needed for wheezing or shortness of breath.    Marland Kitchen amitriptyline (ELAVIL) 25 MG tablet Take 25 mg by mouth at bedtime.    . bisacodyl (DULCOLAX) 10 MG suppository Place 1 suppository (10 mg total) rectally daily as needed for severe constipation. 12 suppository 0  . clonazePAM (KLONOPIN) 1 MG tablet Take 0.5-1 tablets (0.5-1 mg total) by mouth 3 (three) times daily as needed for anxiety. 30 tablet 0  . dexamethasone (DECADRON) 4 MG tablet Take 2 tablets TID with food. Taper per instructions. 90 tablet 0  . docusate sodium (COLACE) 100 MG capsule Take 2 capsules (200 mg total) by mouth at bedtime. 30 capsule 0  . fluconazole (DIFLUCAN) 100 MG tablet Take 2 tablets today, then 1 tablet daily x 20 more days. 22 tablet 0  . fluticasone (FLONASE) 50 MCG/ACT nasal spray Place 1 spray into the nose.    . furosemide (LASIX) 20 MG tablet Take 2 tablets (40 mg total) by mouth 2 (two) times daily. 20 tablet 0  . glipiZIDE (GLUCOTROL) 5 MG tablet Take 1 tablet (5 mg total) by mouth daily before breakfast. 30 tablet 0  . ipratropium (ATROVENT  HFA) 17 MCG/ACT inhaler Inhale 2 puffs into the lungs every 4 (four) hours as needed.     . ondansetron (ZOFRAN) 8 MG tablet Take 8 mg by mouth.    . Oxycodone HCl 10 MG TABS Take 1-2 tabs q 4hrs prn 120 tablet 0  . polyethylene glycol (MIRALAX / GLYCOLAX) packet Take 17 g by mouth 2 (two) times daily as needed for mild constipation. 14 each 0  . promethazine (PHENERGAN) 25 MG suppository Place 25 mg rectally.    Marland Kitchen  QUEtiapine (SEROQUEL) 400 MG tablet Take 400 mg by mouth at bedtime.    . ranitidine (ZANTAC) 150 MG tablet Take 150 mg by mouth 2 (two) times daily.    . traZODone (DESYREL) 50 MG tablet Take 100 mg by mouth.    . zolpidem (AMBIEN) 10 MG tablet TAKE 1 TABLET EVERYDAY AT BEDTIME ONLY  0   No current facility-administered medications for this visit.    SURGICAL HISTORY: History reviewed. No pertinent past surgical history.  REVIEW OF SYSTEMS:  Constitutional: positive for anorexia and fatigue Eyes: negative Ears, nose, mouth, throat, and face: negative Respiratory: negative Cardiovascular: negative Gastrointestinal: negative Genitourinary:negative Integument/breast: negative Hematologic/lymphatic: negative, Swelling of the lower extremities Musculoskeletal:negative Neurological: positive for weakness Behavioral/Psych: negative Endocrine: negative Allergic/Immunologic: negative   PHYSICAL EXAMINATION: General appearance: alert, cooperative, fatigued and no distress Head: Normocephalic, without obvious abnormality, atraumatic Neck: no adenopathy, no JVD, supple, symmetrical, trachea midline and thyroid not enlarged, symmetric, no tenderness/mass/nodules Lymph nodes: Cervical, supraclavicular, and axillary nodes normal. Resp: clear to auscultation bilaterally Back: symmetric, no curvature. ROM normal. No CVA tenderness. Cardio: regular rate and rhythm, S1, S2 normal, no murmur, click, rub or gallop GI: soft, non-tender; bowel sounds normal; no masses,  no organomegaly Extremities: edema 2+ edema bilaterally Neurologic: Alert and oriented X 3, normal strength and tone. Normal symmetric reflexes. Normal coordination and gait  ECOG PERFORMANCE STATUS: 1 - Symptomatic but completely ambulatory  Blood pressure 134/80, pulse 112, temperature 98.2 F (36.8 C), temperature source Oral, resp. rate 20, height 6' (1.829 m), weight 213 lb (96.616 kg), SpO2 98 %.  LABORATORY DATA: Lab  Results  Component Value Date   WBC 5.1 08/17/2015   HGB 12.4* 08/17/2015   HCT 35.9* 08/17/2015   MCV 98.9* 08/17/2015   PLT 50* 08/17/2015      Chemistry      Component Value Date/Time   NA 130* 08/17/2015 1510   NA 142 07/18/2015 0518   K 3.6 08/17/2015 1510   K 3.7 07/18/2015 0518   CL 108 07/18/2015 0518   CO2 30* 08/17/2015 1510   CO2 23 07/18/2015 0518   BUN 23.9 08/17/2015 1510   BUN 15 07/18/2015 0518   CREATININE 1.3 08/17/2015 1510   CREATININE 1.26* 07/18/2015 0518      Component Value Date/Time   CALCIUM 8.2* 08/17/2015 1510   CALCIUM 9.5 07/18/2015 0518   ALKPHOS 67 08/17/2015 1510   ALKPHOS 63 07/17/2015 1524   AST 40* 08/17/2015 1510   AST 13* 07/17/2015 1524   ALT 60* 08/17/2015 1510   ALT 9* 07/17/2015 1524   BILITOT 0.59 08/17/2015 1510   BILITOT 0.5 07/17/2015 1524       RADIOGRAPHIC STUDIES: Ct Chest W Contrast  08/18/2015  : This is a repeat dictation. Study was also dictated under accession [id number] (CT CHEST) and [id number] (CT ABDOMEN/PELVIS). Please note that only one CT chest examination was performed. CLINICAL DATA:  Restaging small cell right lung cancer, with brain  metastases. Diagnosed in January 2016, XRT complete October 2016. Intermittent shortness of breath. Lower extremity peripheral edema. EXAM: CT CHEST, ABDOMEN, AND PELVIS WITH CONTRAST TECHNIQUE: Multidetector CT imaging of the chest, abdomen and pelvis was performed following the standard protocol during bolus administration of intravenous contrast. CONTRAST:  64m OMNIPAQUE IOHEXOL 300 MG/ML  SOLN COMPARISON:  None. FINDINGS: CT CHEST FINDINGS Mediastinum/Nodes: The heart is normal in size. Trace pericardial effusion. Right chest port terminates at the cavoatrial junction. No suspicious mediastinal, hilar, or axillary lymphadenopathy. Visualized thyroid is unremarkable. Lungs/Pleura: Evaluation is constrained by lack of preprocedural imaging for comparison. Suspected radiation  changes in the right upper lobe with associated volume loss and bronchiectasis. 3.1 x 1.8 cm cavitary lesion with fluid is favored to reflect a dilated bronchus. However, there is an underlying 1.3 x 2.6 cm spiculated/nodular opacity in the right lung apex (series 5/image 16) which is worrisome for residual tumor. Additionally, there are bilateral pulmonary nodules which are suspicious for metastases, including: --1.2 x 1.5 cm irregular left upper lobe nodule (series 5/image 20) --2.0 x 2.0 cm irregular left lower lobe nodule (series 5/ image 30) --1.3 x 1.7 cm nodule at the posterior right lung base (series 5/image 45) Trace fluid/pleural thickening along the lateral right upper hemithorax. Small right pleural effusion. No pneumothorax. Musculoskeletal: Mild degenerative changes of the thoracic spine. CT ABDOMEN PELVIS FINDINGS Hepatobiliary: Liver is within normal limits. No suspicious/enhancing hepatic lesions. Gallbladder is unremarkable. No intrahepatic or extrahepatic ductal dilatation. Pancreas: Within normal limits. Spleen: Within normal limits. Adrenals/Urinary Tract: Adrenal glands are within normal limits. 3.4 x 2.9 cm hypoenhancing mass in the lateral right upper kidney (series 2/image 64), worrisome for metastasis, less likely solid renal neoplasm. Three 1-2 mm nonobstructing calculi in the right lower pole (series 2/ images 71 and 73). No hydronephrosis. Left kidney is within normal limits. Bladder is within normal limits. Stomach/Bowel: Stomach is within normal limits. No evidence of bowel obstruction. Normal appendix. Vascular/Lymphatic: Mild atherosclerotic calcifications of the abdominal aorta and branch vessels. No suspicious abdominopelvic lymphadenopathy. Reproductive: Prostate is unremarkable. Other: No abdominopelvic ascites. Musculoskeletal: Degenerative changes of the lumbar spine. IMPRESSION: Evaluation is constrained by lack of preprocedural imaging for comparison. The current study will  serve as the post treatment baseline. Suspected radiation changes with volume loss in the right upper lobe. Associated small right pleural effusion. Underlying 1.3 x 2.6 cm spiculated/nodular opacity in the right lung apex is worrisome for residual tumor. Additional bilateral pulmonary nodules, measuring up to 2.0 cm in the left lower lobe, suspicious for metastases. 3.4 cm mass in the lateral right upper kidney, worrisome for metastasis, less likely solid renal neoplasm. Electronically Signed   By: SJulian HyM.D.   On: 08/18/2015 11:30   Ct Chest W Contrast  08/18/2015  CLINICAL DATA:  Restaging small cell right lung cancer, with brain metastases. Diagnosed in January 2016, XRT complete October 2016. Intermittent shortness of breath. Lower extremity peripheral edema. EXAM: CT CHEST, ABDOMEN, AND PELVIS WITH CONTRAST TECHNIQUE: Multidetector CT imaging of the chest, abdomen and pelvis was performed following the standard protocol during bolus administration of intravenous contrast. CONTRAST:  100 mL Omnipaque 300 IV COMPARISON:  None. FINDINGS: CT CHEST FINDINGS Mediastinum/Nodes: The heart is normal in size. Trace pericardial effusion. Right chest port terminates at the cavoatrial junction. No suspicious mediastinal, hilar, or axillary lymphadenopathy. Visualized thyroid is unremarkable. Lungs/Pleura: Evaluation is constrained by lack of preprocedural imaging for comparison. Suspected radiation changes in the right upper lobe with associated  volume loss and bronchiectasis. 3.1 x 1.8 cm cavitary lesion with fluid is favored to reflect a dilated bronchus. However, there is an underlying 1.3 x 2.6 cm spiculated/nodular opacity in the right lung apex (series 5/image 16) which is worrisome for residual tumor. Additionally, there are bilateral pulmonary nodules which are suspicious for metastases, including: 1.2 x 1.5 cm irregular left upper lobe nodule (series 5/image 20) 2.0 x 2.0 cm irregular left lower  lobe nodule (series 5/ image 30) 1.3 x 1.7 cm nodule at the posterior right lung base (series 5/ image 45) Trace fluid/pleural thickening along the lateral right upper hemithorax. Small right pleural effusion. No pneumothorax. Musculoskeletal: Mild degenerative changes of the thoracic spine. CT ABDOMEN PELVIS FINDINGS Hepatobiliary: Liver is within normal limits. No suspicious/enhancing hepatic lesions. Gallbladder is unremarkable. No intrahepatic or extrahepatic ductal dilatation. Pancreas: Within normal limits. Spleen: Within normal limits. Adrenals/Urinary Tract: Adrenal glands are within normal limits. 3.4 x 2.9 cm hypoenhancing mass in the lateral right upper kidney (series 2/image 64), worrisome for metastasis, less likely solid renal neoplasm. Three 1-2 mm nonobstructing calculi in the right lower pole (series 2/ images 71 and 73). No hydronephrosis. Left kidney is within normal limits. Bladder is within normal limits. Stomach/Bowel: Stomach is within normal limits. No evidence of bowel obstruction. Normal appendix. Vascular/Lymphatic: Mild atherosclerotic calcifications of the abdominal aorta and branch vessels. No suspicious abdominopelvic lymphadenopathy. Reproductive: Prostate is unremarkable. Other: No abdominopelvic ascites. Musculoskeletal: Degenerative changes of the lumbar spine. IMPRESSION: Evaluation is constrained by lack of preprocedural imaging for comparison. The current study will serve as the post treatment baseline. Suspected radiation changes with volume loss in the right upper lobe. Associated small right pleural effusion. Underlying 1.3 x 2.6 cm spiculated/nodular opacity in the right lung apex is worrisome for residual tumor. Additional bilateral pulmonary nodules, measuring up to 2.0 cm in the left lower lobe, suspicious for metastases. 3.4 cm mass in the lateral right upper kidney, worrisome for metastasis, less likely solid renal neoplasm. Electronically Signed   By: Julian Hy  M.D.   On: 08/18/2015 09:34   Mr Jeri Cos WC Contrast  07/22/2015  CLINICAL DATA:  Small cell lung cancer status post chemo radiation. Restaging. EXAM: MRI HEAD WITHOUT AND WITH CONTRAST TECHNIQUE: Multiplanar, multiecho pulse sequences of the brain and surrounding structures were obtained without and with intravenous contrast. CONTRAST:  Contrast dose currently not available.  Reference EPIC. COMPARISON:  03/26/2014 FINDINGS: Calvarium and upper cervical spine: No evidence of osseous metastasis. Orbits: Cataract resections.  No evidence of metastatic spread Sinuses and Mastoids: Subtotal bilateral mastoid effusion, chronic and stable based on 2015 study. Clear nasopharynx. Clear paranasal sinuses. Brain: Known cerebral metastases based on head CT 5 days ago. Measurements and image numbers are in the axial plane on series 10: 1. Right frontal lobe on image 109 measuring up to 22 mm. 2. Subcortical posterior right frontal lobe on image 125 measuring 8 mm diameter. 3. Inferior left temporal lobe on image 66 measuring 28 x 19 mm. 4. Touching but distinct left temporal lobe lesion on image 82 measuring 28 by 20 mm. 5. Left occipital lobe lesion, the largest, on image 90 measuring 29 x 38 mm. 6. Left parietal white matter on image 118 measuring 15 mm diameter. The metastases have variable degrees of surrounding vasogenic edema with local mass effect. No herniation or ventricular obstruction. The masses have variable degrees of cavitation, greatest in the occipital lobe mass. Blood products are seen on gradient imaging and previous  head CT, greatest in the left temporal region. No incidental infarct, new hematoma, hydrocephalus, or major vessel occlusion. IMPRESSION: 1. 6 cerebral metastases as described above, the largest in the left occipital lobe measuring 29 x 38 mm. 2. Variable vasogenic edema with local mass effect but no shift. 3. Metastases are cystic/hemorrhagic. No significant hemorrhage since head CT  07/17/2015. Electronically Signed   By: Monte Fantasia M.D.   On: 07/22/2015 15:31   Ct Abdomen Pelvis W Contrast  08/18/2015  CLINICAL DATA:  Restaging small cell right lung cancer, with brain metastases. Diagnosed in January 2016, XRT complete October 2016. Intermittent shortness of breath. Lower extremity peripheral edema. EXAM: CT CHEST, ABDOMEN, AND PELVIS WITH CONTRAST TECHNIQUE: Multidetector CT imaging of the chest, abdomen and pelvis was performed following the standard protocol during bolus administration of intravenous contrast. CONTRAST:  100 mL Omnipaque 300 IV COMPARISON:  None. FINDINGS: CT CHEST FINDINGS Mediastinum/Nodes: The heart is normal in size. Trace pericardial effusion. Right chest port terminates at the cavoatrial junction. No suspicious mediastinal, hilar, or axillary lymphadenopathy. Visualized thyroid is unremarkable. Lungs/Pleura: Evaluation is constrained by lack of preprocedural imaging for comparison. Suspected radiation changes in the right upper lobe with associated volume loss and bronchiectasis. 3.1 x 1.8 cm cavitary lesion with fluid is favored to reflect a dilated bronchus. However, there is an underlying 1.3 x 2.6 cm spiculated/nodular opacity in the right lung apex (series 5/image 16) which is worrisome for residual tumor. Additionally, there are bilateral pulmonary nodules which are suspicious for metastases, including: 1.2 x 1.5 cm irregular left upper lobe nodule (series 5/image 20) 2.0 x 2.0 cm irregular left lower lobe nodule (series 5/ image 30) 1.3 x 1.7 cm nodule at the posterior right lung base (series 5/ image 45) Trace fluid/pleural thickening along the lateral right upper hemithorax. Small right pleural effusion. No pneumothorax. Musculoskeletal: Mild degenerative changes of the thoracic spine. CT ABDOMEN PELVIS FINDINGS Hepatobiliary: Liver is within normal limits. No suspicious/enhancing hepatic lesions. Gallbladder is unremarkable. No intrahepatic or  extrahepatic ductal dilatation. Pancreas: Within normal limits. Spleen: Within normal limits. Adrenals/Urinary Tract: Adrenal glands are within normal limits. 3.4 x 2.9 cm hypoenhancing mass in the lateral right upper kidney (series 2/image 64), worrisome for metastasis, less likely solid renal neoplasm. Three 1-2 mm nonobstructing calculi in the right lower pole (series 2/ images 71 and 73). No hydronephrosis. Left kidney is within normal limits. Bladder is within normal limits. Stomach/Bowel: Stomach is within normal limits. No evidence of bowel obstruction. Normal appendix. Vascular/Lymphatic: Mild atherosclerotic calcifications of the abdominal aorta and branch vessels. No suspicious abdominopelvic lymphadenopathy. Reproductive: Prostate is unremarkable. Other: No abdominopelvic ascites. Musculoskeletal: Degenerative changes of the lumbar spine. IMPRESSION: Evaluation is constrained by lack of preprocedural imaging for comparison. The current study will serve as the post treatment baseline. Suspected radiation changes with volume loss in the right upper lobe. Associated small right pleural effusion. Underlying 1.3 x 2.6 cm spiculated/nodular opacity in the right lung apex is worrisome for residual tumor. Additional bilateral pulmonary nodules, measuring up to 2.0 cm in the left lower lobe, suspicious for metastases. 3.4 cm mass in the lateral right upper kidney, worrisome for metastasis, less likely solid renal neoplasm. Electronically Signed   By: Julian Hy M.D.   On: 08/18/2015 09:34    ASSESSMENT AND PLAN: This is a very pleasant 49 years old white male with recurrent small cell lung cancer with multiple brain metastasis. This was initially diagnosed as limited stage disease in October 2015  status post systemic chemotherapy with cisplatin and etoposide concurrent with radiation. He recently underwent stereotactic radiotherapy to metastatic brain lesions. Unfortunately the recent CT scan of the  chest showed evidence for disease progression in the right lung apex as well as bilateral pulmonary nodules and suspicious metastatic lesions in the right upper kidney. I had a lengthy discussion with the patient and his family today about his current disease status and treatment options. I discussed with the patient several options including palliative care and hospice referral versus consideration of systemic chemotherapy versus referral to [facility] for consideration of clinical trial with immunotherapy. Unfortunately his platelets count are low today and he is not a good candidate to start systemic chemotherapy at this point. The patient and his family would like to consider the clinical trial at [facility]. I will refer the patient to see Dr. Ready or one of his associate for evaluation and recommendation regarding his condition. I will also arrange for the patient to come back for follow-up visit in 2 weeks for reevaluation and repeat blood work for close monitoring of his platelets count. For the lower extremity edema, he will continue on Lasix 40 mg by mouth twice a day for 4 more days, then down to 20 mg by mouth twice a day as previously scheduled. The patient and his family understand that he has a very serious condition and his prognosis is very poor at this point. He has not made a decision regarding his CODE STATUS at this point. The patient was advised to call immediately if he has any concerning symptoms in the interval. The patient voices understanding of current disease status and treatment options and is in agreement with the current care plan.  All questions were answered. The patient knows to call the clinic with any problems, questions or concerns. We can certainly see the patient much sooner if necessary.  I spent 15 minutes counseling the patient face to face. The total time spent in the appointment was 25 minutes.  Disclaimer: This note  was dictated with voice recognition software. Similar sounding words can inadvertently be transcribed and may not be corrected upon review.

## 2015-08-19 NOTE — Telephone Encounter (Signed)
Left message for Dr. Ready np coord to return call [phone], per vm calls will be return within 2 business days

## 2015-08-22 ENCOUNTER — Other Ambulatory Visit: Payer: Self-pay | Admitting: Medical Oncology

## 2015-08-22 ENCOUNTER — Telehealth: Payer: Self-pay | Admitting: *Deleted

## 2015-08-22 ENCOUNTER — Telehealth: Payer: Self-pay | Admitting: Internal Medicine

## 2015-08-22 DIAGNOSIS — C7931 Secondary malignant neoplasm of brain: Secondary | ICD-10-CM

## 2015-08-22 DIAGNOSIS — C3491 Malignant neoplasm of unspecified part of right bronchus or lung: Secondary | ICD-10-CM

## 2015-08-22 MED ORDER — OXYCODONE HCL ER 20 MG PO T12A: 20.0000 mg | EXTENDED_RELEASE_TABLET | Freq: Two times a day (BID) | ORAL | Status: DC

## 2015-08-22 MED ORDER — OXYCODONE HCL 10 MG PO TABS: ORAL_TABLET | ORAL | Status: DC

## 2015-08-22 NOTE — Progress Notes (Signed)
Per Julien Nordmann he ordered oxycontin 20 mg q 12 and decreased oxycodone to 10 q 6 prn. Pt notified to pick up rx.

## 2015-08-22 NOTE — Addendum Note (Signed)
Addended by: Ardeen Garland on: 08/22/2015 03:53 PM   Modules accepted: Orders, Medications

## 2015-08-22 NOTE — Telephone Encounter (Signed)
Called patient to give appt with Dr. Ready per patient will have to call back after speaking with wife.

## 2015-08-22 NOTE — Telephone Encounter (Signed)
Wife called.  "Glen Mendez is in pain and needs to increase pain med or needs morphine.  He does not need to be in pain.  Takes lasu=ix 40 mg twice a day and the swelling has not gone down.  Head is drooping, can't holds hid head up so back of his neck hurts.  Hurts everywhere, his back, legs, keeps a headache all the time.  Have Dr. Julien Nordmann call me at [phone]."  Confirms two oxycodone 10 mg every four hours around the clock.

## 2015-08-22 NOTE — Addendum Note (Signed)
Addended by: Ardeen Garland on: 08/22/2015 03:54 PM   Modules accepted: Orders, Medications

## 2015-08-23 NOTE — Telephone Encounter (Signed)
Progress Notes      Ardeen Garland, RN at 08/22/2015 2:13 PM     Status: Signed       Expand All Collapse All   Per Julien Nordmann he ordered oxycontin 20 mg q 12 and decreased oxycodone to 10 q 6 prn. Pt notified to pick up rx.

## 2015-08-24 ENCOUNTER — Encounter: Payer: Self-pay | Admitting: Internal Medicine

## 2015-08-24 ENCOUNTER — Other Ambulatory Visit: Payer: Self-pay | Admitting: Medical Oncology

## 2015-08-24 ENCOUNTER — Telehealth: Payer: Self-pay | Admitting: Internal Medicine

## 2015-08-24 ENCOUNTER — Telehealth: Payer: Self-pay | Admitting: Medical Oncology

## 2015-08-24 NOTE — Telephone Encounter (Signed)
Left message for Glen Mendez to return call [phone].

## 2015-08-24 NOTE — Telephone Encounter (Signed)
Received call from [place] @ Dr. Ready due to patient having Medicaid [place] referral was sent for review urgent request. Per Dr. Ready patient can be seen as a second opinion not clinical trial due to plts ct are low and not eligible. VM left for nurse

## 2015-08-24 NOTE — Telephone Encounter (Signed)
Neck drooping ,  Fluid is trying to seep from his legs and he is starting to get leg sores.Taking lasix. Asking about ted hose. Per Julien Nordmann it is okay for pt to go for second opinion at [facility] and  okay for TED hose. Does not want pt to have muscle relaxant. Wife notified. . Note to Tiffany.

## 2015-08-26 ENCOUNTER — Telehealth: Payer: Self-pay | Admitting: Internal Medicine

## 2015-08-26 NOTE — Telephone Encounter (Signed)
PER KATRINA @ DR. READY OFFICE LEFT MESSAGE FOR PATIENT TO RETURN CALL TO SCHEDULE APPT [phone]

## 2015-08-29 ENCOUNTER — Telehealth: Payer: Self-pay

## 2015-08-29 ENCOUNTER — Telehealth: Payer: Self-pay | Admitting: *Deleted

## 2015-08-29 DIAGNOSIS — C3491 Malignant neoplasm of unspecified part of right bronchus or lung: Secondary | ICD-10-CM

## 2015-08-29 MED ORDER — MORPHINE SULFATE ER 30 MG PO TBCR: 30.0000 mg | EXTENDED_RELEASE_TABLET | Freq: Two times a day (BID) | ORAL | Status: DC

## 2015-08-29 MED ORDER — FUROSEMIDE 20 MG PO TABS: 40.0000 mg | ORAL_TABLET | Freq: Two times a day (BID) | ORAL | Status: DC

## 2015-08-29 NOTE — Telephone Encounter (Signed)
Call from pt wife request for refill on Lasix and Oxy ER '20mg'$  was denied by medicaid. Request for Morphine. Reviewed with MD, Pt to bring in Oxy ER '20mg'$  rx and new Rx for msContin '30mg'$  BID will be given. Rx for lasix sent to pt pharmacy

## 2015-08-29 NOTE — Telephone Encounter (Signed)
[name] called this morning with several questions regarding her husband Mr. Glen Mendez. She was concerned because his neck was drooping and he is out of his oxycodone for pain. When reviewing his chart, I observed Dr. Earlie Server (Medical Oncologist) was already aware of this issue. I also informed her that Dr. Earlie Server would need to decide to renew any oxycodone if that was his decision. [name] has an appointment with [name] this Wednesday 10/26, and she was aware of this appointment. I encouraged her to call Dr. Lew Dawes nurse for her questions about his medication.

## 2015-08-30 ENCOUNTER — Telehealth: Payer: Self-pay | Admitting: *Deleted

## 2015-08-30 NOTE — Telephone Encounter (Signed)
Call to pt pharmacy to verify Oxycotin '20mg'$  ER Rx was not filled.  This medication will need prior authorization. Discussed with pharmacist a list of other Narcotic pain meds and all other options will also need prior auth. Call placed to Medicaid office to assist with Prior Auth. For OxyCotin, I was instructed to go online and fill out information. Form downloaded and faxed to [facility] office for prior Auth. Approval. Walked out to lobby to discuss above with pt's wife, unfortunately she had left.  Called and left message on wife phone with above information.

## 2015-08-31 ENCOUNTER — Telehealth: Payer: Self-pay | Admitting: *Deleted

## 2015-08-31 ENCOUNTER — Telehealth: Payer: Self-pay | Admitting: Physician Assistant

## 2015-08-31 ENCOUNTER — Other Ambulatory Visit: Payer: Medicaid Other

## 2015-08-31 ENCOUNTER — Ambulatory Visit: Payer: Medicaid Other | Admitting: Physician Assistant

## 2015-08-31 NOTE — Addendum Note (Signed)
Encounter addended by: Kyung Rudd, MD on: 08/31/2015  7:23 PM<BR>     Documentation filed: Visit Diagnoses, Notes Section

## 2015-08-31 NOTE — Telephone Encounter (Signed)
  [name] here with pt's Oxycontin Rx. [name] states she did not receive any messages regarding status of pt's pain medication. [name] advised pt was unable to get here today for his appt as he woke up late.  I discussed with [name], I spent over an hour with the pharmacy trying to find a narcotic that would authorized by medicaid. Call placed to  [facility] office [phone] to discuss options for pain medication and protocol for prior authorization. I was instructed to go online download form and submit via fax. They will not authorize medications over the phone.  Again I discussed this with [name] and explained I have refaxed the request for prior auth on Oxycontin '20mg'$  BID to Medicaid and they advised it will be 24-72Hr before we will have an answer  Informed [name] I will discuss options with MD however he is seeing pt's and it may be up to 30-1hr before I am able to get her additional information. [name] advised they will be leaving on Saturday for a trip. Pt's has an appt at [facility] 15.  [name] gave Ph# [phone] as call back number and to leave message if she is unavailable  Reviewed above with MD who advised we are waiting on Medicaid to approve unable to write additional rx for pain at this time. Pt to be evaluted in ED if in pain until medication is approved.   Call to Pt's pharmacy spoke with Clair Gulling at [place] location who advised Oxycontin 20 was ready for pickup copay of $3.00 Charlesetta Ivory at [phone] per her request lm with pt medication is ready for pick up at [place] location. He confirmed he would give [name] message

## 2015-08-31 NOTE — Progress Notes (Signed)
  Radiation Oncology ([phone]) [phone] ________________________________  Stereotactic Treatment Procedure Note OUTPATIENT    ICD-9-CM ICD-10-CM   1. Brain metastases 198.3 C79.31 dexamethasone (DECADRON) 4 MG tablet     oxyCODONE 10 MG TABS     fluconazole (DIFLUCAN) 100 MG tablet     Name: Glen Mendez: [id number] Date: 9/30/2016DOB: [birth year]  SPECIAL TREATMENT PROCEDURE  3D TREATMENT PLANNING AND DOSIMETRY: The patient's radiation plan was reviewed and approved by neurosurgery and radiation oncology prior to treatment. It showed 3-dimensional radiation distributions overlaid onto the planning CT/MRI image set. The [place] for the target structures as well as the organs at risk were reviewed. The documentation of the 3D plan and dosimetry are filed in the radiation oncology EMR.  NARRATIVE: Glen Mendez was brought to the TrueBeam stereotactic radiation treatment machine and placed supine on the CT couch. The head frame was applied, and the patient was set up for stereotactic radiosurgery. Neurosurgery was present for the set-up and delivery  SIMULATION VERIFICATION: In the couch zero-angle position, the patient underwent Exactrac imaging using the Brainlab system with orthogonal KV images. These were carefully aligned and repeated to confirm treatment position for each of the isocenters. The Exactrac snap film verification was repeated at each couch angle.  SPECIAL TREATMENT PROCEDURE: Glen Mendez received stereotactic radiosurgery to the following targets:  Left Occipital 38 mm, Left Superior Temporal 31 mm, Left Inferior Temporal 29 mm, Right Frontal 60m, Left Parietal 16m Right Posterior Frontal 49m53margets were treated using 10 Rapid Arc VMAT Beams to a prescription dose of 7 Gy. ExacTrac Snap verification was performed for each couch angle.   Current dose: 14 Gy  This constitutes a special  treatment procedure due to the ablative dose delivered and the technical nature of treatment. This highly technical modality of treatment ensures that the ablative dose is centered on the patient's tumor while sparing normal tissues from excessive dose and risk of detrimental effects.  STEREOTACTIC TREATMENT MANAGEMENT: Following delivery, the patient was transported to nursing in stable condition and monitored for possible acute effects.   Filed Vitals:   08/05/15 1359  BP: 114/73  Pulse: 90  Temp: 97.7 F (36.5 C)  Resp: 20     The patient tolerated treatment without significant acute effects, and was discharged to home in stable condition.   PLAN: Followup on 08/08/15 for final fraction.  ------------------------------------------------  Glen GrossD, PhD

## 2015-08-31 NOTE — Telephone Encounter (Signed)
Received a call from triage ([place]) in reference to Mr. Blatt informing her that they had over slept for the appointment this morning and would like to come in at 12 today if possible. Call back placed to Ms. Teigen informing her that Clarise Cruz  Can not see him at 14 and to reschedule. Ms. Laviolette verbalized understanding.

## 2015-09-01 ENCOUNTER — Other Ambulatory Visit: Payer: Medicaid Other

## 2015-09-01 ENCOUNTER — Ambulatory Visit: Payer: Medicaid Other | Admitting: Internal Medicine

## 2015-09-05 ENCOUNTER — Encounter: Payer: Self-pay | Admitting: Internal Medicine

## 2015-09-05 NOTE — Progress Notes (Signed)
I faxed nctraks req for oxycontin

## 2015-09-09 ENCOUNTER — Telehealth: Payer: Self-pay

## 2015-09-09 ENCOUNTER — Ambulatory Visit: Admission: RE | Admit: 2015-09-09 | Payer: Medicaid Other | Source: Ambulatory Visit | Admitting: Radiation Oncology

## 2015-09-09 NOTE — Telephone Encounter (Signed)
I called Mr. Auld to inquire about his missed appointment today at 11:00 with Dr. Isidore Moos for follow up. I was not able to leave a message on his home phone (no space on voice-mail) and the cell phone listed is no longer in service.

## 2015-09-10 ENCOUNTER — Inpatient Hospital Stay (HOSPITAL_COMMUNITY)
Admission: EM | Admit: 2015-09-10 | Discharge: 2015-09-14 | DRG: 871 | Disposition: A | Discharge status: Hospice | Payer: Medicaid Other | Attending: Pulmonary Disease | Admitting: Pulmonary Disease

## 2015-09-10 ENCOUNTER — Emergency Department (HOSPITAL_COMMUNITY): Payer: Medicaid Other

## 2015-09-10 DIAGNOSIS — I10 Essential (primary) hypertension: Secondary | ICD-10-CM | POA: Diagnosis present

## 2015-09-10 DIAGNOSIS — Z888 Allergy status to other drugs, medicaments and biological substances status: Secondary | ICD-10-CM

## 2015-09-10 DIAGNOSIS — R21 Rash and other nonspecific skin eruption: Secondary | ICD-10-CM | POA: Diagnosis present

## 2015-09-10 DIAGNOSIS — L8990 Pressure ulcer of unspecified site, unspecified stage: Secondary | ICD-10-CM | POA: Insufficient documentation

## 2015-09-10 DIAGNOSIS — F17210 Nicotine dependence, cigarettes, uncomplicated: Secondary | ICD-10-CM | POA: Diagnosis present

## 2015-09-10 DIAGNOSIS — Z515 Encounter for palliative care: Secondary | ICD-10-CM | POA: Diagnosis present

## 2015-09-10 DIAGNOSIS — N179 Acute kidney failure, unspecified: Secondary | ICD-10-CM | POA: Diagnosis present

## 2015-09-10 DIAGNOSIS — F17200 Nicotine dependence, unspecified, uncomplicated: Secondary | ICD-10-CM | POA: Diagnosis present

## 2015-09-10 DIAGNOSIS — G4700 Insomnia, unspecified: Secondary | ICD-10-CM | POA: Diagnosis present

## 2015-09-10 DIAGNOSIS — J449 Chronic obstructive pulmonary disease, unspecified: Secondary | ICD-10-CM | POA: Diagnosis present

## 2015-09-10 DIAGNOSIS — M25572 Pain in left ankle and joints of left foot: Secondary | ICD-10-CM | POA: Diagnosis not present

## 2015-09-10 DIAGNOSIS — L89899 Pressure ulcer of other site, unspecified stage: Secondary | ICD-10-CM | POA: Diagnosis present

## 2015-09-10 DIAGNOSIS — T451X5A Adverse effect of antineoplastic and immunosuppressive drugs, initial encounter: Secondary | ICD-10-CM | POA: Diagnosis present

## 2015-09-10 DIAGNOSIS — D72819 Decreased white blood cell count, unspecified: Secondary | ICD-10-CM | POA: Diagnosis present

## 2015-09-10 DIAGNOSIS — D696 Thrombocytopenia, unspecified: Secondary | ICD-10-CM | POA: Diagnosis present

## 2015-09-10 DIAGNOSIS — C3490 Malignant neoplasm of unspecified part of unspecified bronchus or lung: Secondary | ICD-10-CM | POA: Diagnosis present

## 2015-09-10 DIAGNOSIS — J13 Pneumonia due to Streptococcus pneumoniae: Secondary | ICD-10-CM | POA: Diagnosis present

## 2015-09-10 DIAGNOSIS — D630 Anemia in neoplastic disease: Secondary | ICD-10-CM | POA: Diagnosis present

## 2015-09-10 DIAGNOSIS — R579 Shock, unspecified: Secondary | ICD-10-CM | POA: Diagnosis present

## 2015-09-10 DIAGNOSIS — L89223 Pressure ulcer of left hip, stage 3: Secondary | ICD-10-CM | POA: Diagnosis present

## 2015-09-10 DIAGNOSIS — E860 Dehydration: Secondary | ICD-10-CM | POA: Diagnosis present

## 2015-09-10 DIAGNOSIS — K1230 Oral mucositis (ulcerative), unspecified: Secondary | ICD-10-CM | POA: Diagnosis present

## 2015-09-10 DIAGNOSIS — E2740 Unspecified adrenocortical insufficiency: Secondary | ICD-10-CM | POA: Diagnosis present

## 2015-09-10 DIAGNOSIS — F319 Bipolar disorder, unspecified: Secondary | ICD-10-CM | POA: Diagnosis present

## 2015-09-10 DIAGNOSIS — Z7952 Long term (current) use of systemic steroids: Secondary | ICD-10-CM

## 2015-09-10 DIAGNOSIS — Z885 Allergy status to narcotic agent status: Secondary | ICD-10-CM

## 2015-09-10 DIAGNOSIS — Z7984 Long term (current) use of oral hypoglycemic drugs: Secondary | ICD-10-CM

## 2015-09-10 DIAGNOSIS — J9601 Acute respiratory failure with hypoxia: Secondary | ICD-10-CM | POA: Diagnosis present

## 2015-09-10 DIAGNOSIS — A419 Sepsis, unspecified organism: Principal | ICD-10-CM | POA: Diagnosis present

## 2015-09-10 DIAGNOSIS — E872 Acidosis: Secondary | ICD-10-CM | POA: Diagnosis present

## 2015-09-10 DIAGNOSIS — C7931 Secondary malignant neoplasm of brain: Secondary | ICD-10-CM | POA: Diagnosis present

## 2015-09-10 DIAGNOSIS — Z6827 Body mass index (BMI) 27.0-27.9, adult: Secondary | ICD-10-CM

## 2015-09-10 DIAGNOSIS — D72829 Elevated white blood cell count, unspecified: Secondary | ICD-10-CM | POA: Diagnosis present

## 2015-09-10 DIAGNOSIS — Z7982 Long term (current) use of aspirin: Secondary | ICD-10-CM

## 2015-09-10 DIAGNOSIS — J189 Pneumonia, unspecified organism: Secondary | ICD-10-CM

## 2015-09-10 DIAGNOSIS — I76 Septic arterial embolism: Secondary | ICD-10-CM | POA: Diagnosis present

## 2015-09-10 DIAGNOSIS — R52 Pain, unspecified: Secondary | ICD-10-CM

## 2015-09-10 DIAGNOSIS — Z66 Do not resuscitate: Secondary | ICD-10-CM | POA: Diagnosis present

## 2015-09-10 DIAGNOSIS — L89022 Pressure ulcer of left elbow, stage 2: Secondary | ICD-10-CM | POA: Diagnosis present

## 2015-09-10 DIAGNOSIS — Z79899 Other long term (current) drug therapy: Secondary | ICD-10-CM

## 2015-09-10 DIAGNOSIS — E876 Hypokalemia: Secondary | ICD-10-CM | POA: Diagnosis present

## 2015-09-10 DIAGNOSIS — R6521 Severe sepsis with septic shock: Secondary | ICD-10-CM | POA: Diagnosis present

## 2015-09-10 DIAGNOSIS — J939 Pneumothorax, unspecified: Secondary | ICD-10-CM | POA: Diagnosis present

## 2015-09-10 DIAGNOSIS — E119 Type 2 diabetes mellitus without complications: Secondary | ICD-10-CM | POA: Diagnosis present

## 2015-09-10 DIAGNOSIS — L89523 Pressure ulcer of left ankle, stage 3: Secondary | ICD-10-CM | POA: Diagnosis present

## 2015-09-10 DIAGNOSIS — C3491 Malignant neoplasm of unspecified part of right bronchus or lung: Secondary | ICD-10-CM

## 2015-09-10 DIAGNOSIS — L89151 Pressure ulcer of sacral region, stage 1: Secondary | ICD-10-CM | POA: Diagnosis present

## 2015-09-10 DIAGNOSIS — Z79891 Long term (current) use of opiate analgesic: Secondary | ICD-10-CM

## 2015-09-10 DIAGNOSIS — E46 Unspecified protein-calorie malnutrition: Secondary | ICD-10-CM | POA: Diagnosis present

## 2015-09-10 DIAGNOSIS — G9340 Encephalopathy, unspecified: Secondary | ICD-10-CM | POA: Diagnosis present

## 2015-09-10 DIAGNOSIS — T380X5A Adverse effect of glucocorticoids and synthetic analogues, initial encounter: Secondary | ICD-10-CM | POA: Diagnosis present

## 2015-09-10 DIAGNOSIS — D6489 Other specified anemias: Secondary | ICD-10-CM | POA: Diagnosis present

## 2015-09-10 DIAGNOSIS — L89213 Pressure ulcer of right hip, stage 3: Secondary | ICD-10-CM | POA: Diagnosis present

## 2015-09-10 LAB — CBG MONITORING, ED: GLUCOSE-CAPILLARY: 132 mg/dL — AB (ref 65–99)

## 2015-09-10 LAB — CBC WITH DIFFERENTIAL/PLATELET
BASOS PCT: 0 %
Basophils Absolute: 0 10*3/uL (ref 0.0–0.1)
EOS ABS: 0 10*3/uL (ref 0.0–0.7)
EOS PCT: 0 %
HCT: 38.4 % — ABNORMAL LOW (ref 39.0–52.0)
HEMOGLOBIN: 12.6 g/dL — AB (ref 13.0–17.0)
LYMPHS PCT: 4 %
Lymphs Abs: 0.3 10*3/uL — ABNORMAL LOW (ref 0.7–4.0)
MCH: 33.9 pg (ref 26.0–34.0)
MCHC: 32.8 g/dL (ref 30.0–36.0)
MCV: 103.2 fL — AB (ref 78.0–100.0)
MONO ABS: 0.2 10*3/uL (ref 0.1–1.0)
Monocytes Relative: 3 %
NEUTROS PCT: 93 %
Neutro Abs: 7.2 10*3/uL (ref 1.7–7.7)
PLATELETS: 41 10*3/uL — AB (ref 150–400)
RBC: 3.72 MIL/uL — AB (ref 4.22–5.81)
RDW: 16.7 % — ABNORMAL HIGH (ref 11.5–15.5)
WBC: 7.7 10*3/uL (ref 4.0–10.5)

## 2015-09-10 LAB — COMPREHENSIVE METABOLIC PANEL
ALK PHOS: 93 U/L (ref 38–126)
ALT: 27 U/L (ref 17–63)
AST: 25 U/L (ref 15–41)
Albumin: 2.6 g/dL — ABNORMAL LOW (ref 3.5–5.0)
Anion gap: 13 (ref 5–15)
BILIRUBIN TOTAL: 1.1 mg/dL (ref 0.3–1.2)
BUN: 75 mg/dL — AB (ref 6–20)
CO2: 29 mmol/L (ref 22–32)
Calcium: 9.3 mg/dL (ref 8.9–10.3)
Chloride: 104 mmol/L (ref 101–111)
Creatinine, Ser: 2.85 mg/dL — ABNORMAL HIGH (ref 0.61–1.24)
GFR calc Af Amer: 29 mL/min — ABNORMAL LOW (ref >60)
GFR calc non Af Amer: 25 mL/min — ABNORMAL LOW (ref >60)
GLUCOSE: 138 mg/dL — AB (ref 65–99)
POTASSIUM: 4.2 mmol/L (ref 3.5–5.1)
Sodium: 146 mmol/L — ABNORMAL HIGH (ref 135–145)
Total Protein: 6.5 g/dL (ref 6.5–8.1)

## 2015-09-10 LAB — I-STAT CG4 LACTIC ACID, ED
LACTIC ACID, VENOUS: 2.31 mmol/L — AB (ref 0.5–2.0)
Lactic Acid, Venous: 0.74 mmol/L (ref 0.5–2.0)

## 2015-09-10 MED ORDER — SODIUM CHLORIDE 0.9 % IV BOLUS (SEPSIS)
1000.0000 mL | Freq: Once | INTRAVENOUS | Status: AC
  Administered 2015-09-10: 1000 mL via INTRAVENOUS

## 2015-09-10 MED ORDER — VANCOMYCIN HCL 10 G IV SOLR: 1250.0000 mg | INTRAVENOUS | Status: DC

## 2015-09-10 MED ORDER — OXYCODONE HCL ER 20 MG PO T12A
20.0000 mg | EXTENDED_RELEASE_TABLET | Freq: Two times a day (BID) | ORAL | Status: DC
  Administered 2015-09-10 – 2015-09-14 (×8): 20 mg via ORAL
  Filled 2015-09-10 (×5): qty 1
  Filled 2015-09-10: qty 2
  Filled 2015-09-10 (×2): qty 1

## 2015-09-10 MED ORDER — PIPERACILLIN-TAZOBACTAM 3.375 G IVPB
3.3750 g | Freq: Three times a day (TID) | INTRAVENOUS | Status: DC
  Administered 2015-09-11 – 2015-09-13 (×7): 3.375 g via INTRAVENOUS
  Filled 2015-09-10 (×7): qty 50

## 2015-09-10 MED ORDER — VANCOMYCIN HCL 10 G IV SOLR
1250.0000 mg | INTRAVENOUS | Status: AC
  Administered 2015-09-10: 1250 mg via INTRAVENOUS
  Filled 2015-09-10: qty 1250

## 2015-09-10 MED ORDER — PIPERACILLIN-TAZOBACTAM 3.375 G IVPB 30 MIN: 3.3750 g | Freq: Three times a day (TID) | INTRAVENOUS | Status: DC

## 2015-09-10 MED ORDER — PIPERACILLIN-TAZOBACTAM 3.375 G IVPB 30 MIN
3.3750 g | INTRAVENOUS | Status: AC
  Administered 2015-09-10: 3.375 g via INTRAVENOUS
  Filled 2015-09-10: qty 50

## 2015-09-10 NOTE — ED Notes (Addendum)
This RN speaks with PA about patient status, plan of care, and inquires about need for antibiotics. Will continue to monitor and wait for further orders.

## 2015-09-10 NOTE — ED Notes (Signed)
EMS reports wife and pt returned from 4 days cruise, dehydrated and altered mental status, bp 84/p 80, 16  Pt cool to touch, pale, having difficulty speaking

## 2015-09-10 NOTE — ED Notes (Signed)
Delay 2nd set blood culture pt in exray

## 2015-09-10 NOTE — Progress Notes (Addendum)
ANTIBIOTIC CONSULT NOTE - INITIAL  Pharmacy Consult for Vancomycin & Zosyn Indication: pneumonia  Allergies  Allergen Reactions  . Prilosec [Omeprazole] Hives and Itching  . Vicodin [Hydrocodone-Acetaminophen]     " makes me itch" i can take it if I take bendryl    Patient Measurements: Height: '6\' 1"'$  (185.4 cm) Weight: 208 lb (94.348 kg) IBW/kg (Calculated) : 79.9  Vital Signs: Temp: 98.9 F (37.2 C) (11/05 1917) Temp Source: Rectal (11/05 1917) BP: 81/48 mmHg (11/05 2231) Pulse Rate: 88 (11/05 2231) Intake/Output from previous day:   Intake/Output from this shift: Total I/O In: 2240 [P.O.:240; I.V.:2000] Out: -   Labs:  Recent Labs  09/10/15 1915  WBC 7.7  HGB 12.6*  PLT 41*  CREATININE 2.85*   Estimated Creatinine Clearance: 36.2 mL/min (by C-G formula based on Cr of 2.85). No results for input(s): VANCOTROUGH, VANCOPEAK, VANCORANDOM, GENTTROUGH, GENTPEAK, GENTRANDOM, TOBRATROUGH, TOBRAPEAK, TOBRARND, AMIKACINPEAK, AMIKACINTROU, AMIKACIN in the last 72 hours.   Microbiology: No results found for this or any previous visit (from the past 720 hour(s)).  Medical History: Past Medical History  Diagnosis Date  . Lung cancer ([place])   . Bipolar disorder ([place])   . COPD (chronic obstructive pulmonary disease) ([place])   . Hypertension   . Diabetes mellitus without complication ([place])   . Brain metastases ([place])   . Radiation     [facility], 10 days to brain, and 6 weeks to chest by Dr. Roxanne Mins    Medications:  Scheduled:  . oxyCODONE  20 mg Oral Q12H   Infusions:  . piperacillin-tazobactam    . [START ON 09/11/2015] piperacillin-tazobactam (ZOSYN)  IV    . vancomycin    . [START ON 09/11/2015] vancomycin     Assessment:  49 yr male presents with weakness, poor appetite and altered mental status.  Chest Xray shows pneumonia  Zosyn per MD initiated in ED and pharmacy consulted to dose Vancomycin.  Upon admission pharmacy asked to  manage Zosyn therapy.  11/5 >>Vanc >> 11/5 >>Zosyn >>  11/6 >> Tamiflu >>   11/5 blood: 11/5 urine:  Trough/Dose change info:   Goal of Therapy:  Vancomycin trough level 15-20 mcg/ml  Plan:  Measure antibiotic drug levels at steady state Follow up culture results  Zosyn 3.'375mg'$  IV q8h (extended infusion)  Vancomycin '1250mg'$  IV q24h  Kenzie Thoreson, Toribio Harbour, PharmD 09/10/2015,10:47 PM

## 2015-09-10 NOTE — ED Notes (Signed)
Pt placed on 2 L [place] for SPO2  Of 83%.

## 2015-09-10 NOTE — ED Notes (Signed)
PA at bedside. Pa aware of even lower BP.

## 2015-09-10 NOTE — ED Notes (Signed)
Pt's wife reports patient has been very weak, poor appetite, and confused over the past 4 days. She reports they went on a cruise and for the entire 4 days patient stayed in bed and only ate a small amount of food. Pt appears pale, weak, and has scabbed sores to lower legs, arms,and buttocks.

## 2015-09-10 NOTE — ED Notes (Signed)
Nurse drawing labs. 

## 2015-09-10 NOTE — ED Notes (Signed)
Py aware of need for a urine sample.

## 2015-09-10 NOTE — ED Notes (Signed)
PA Gerald Stabs and  MD Laneta Simmers at bedside.

## 2015-09-10 NOTE — ED Notes (Signed)
Pt aware of need for a urine sample. Urinal at bedside.Pt unable to void at this time

## 2015-09-11 ENCOUNTER — Inpatient Hospital Stay (HOSPITAL_COMMUNITY): Payer: Medicaid Other

## 2015-09-11 DIAGNOSIS — D72819 Decreased white blood cell count, unspecified: Secondary | ICD-10-CM | POA: Diagnosis present

## 2015-09-11 DIAGNOSIS — R579 Shock, unspecified: Secondary | ICD-10-CM | POA: Diagnosis present

## 2015-09-11 DIAGNOSIS — E872 Acidosis: Secondary | ICD-10-CM | POA: Diagnosis present

## 2015-09-11 DIAGNOSIS — L89223 Pressure ulcer of left hip, stage 3: Secondary | ICD-10-CM | POA: Diagnosis present

## 2015-09-11 DIAGNOSIS — E2740 Unspecified adrenocortical insufficiency: Secondary | ICD-10-CM | POA: Diagnosis present

## 2015-09-11 DIAGNOSIS — E119 Type 2 diabetes mellitus without complications: Secondary | ICD-10-CM | POA: Diagnosis present

## 2015-09-11 DIAGNOSIS — L89022 Pressure ulcer of left elbow, stage 2: Secondary | ICD-10-CM | POA: Diagnosis present

## 2015-09-11 DIAGNOSIS — T451X5A Adverse effect of antineoplastic and immunosuppressive drugs, initial encounter: Secondary | ICD-10-CM | POA: Diagnosis present

## 2015-09-11 DIAGNOSIS — L89899 Pressure ulcer of other site, unspecified stage: Secondary | ICD-10-CM | POA: Diagnosis present

## 2015-09-11 DIAGNOSIS — J189 Pneumonia, unspecified organism: Secondary | ICD-10-CM | POA: Diagnosis not present

## 2015-09-11 DIAGNOSIS — R6521 Severe sepsis with septic shock: Secondary | ICD-10-CM | POA: Diagnosis not present

## 2015-09-11 DIAGNOSIS — N179 Acute kidney failure, unspecified: Secondary | ICD-10-CM | POA: Diagnosis present

## 2015-09-11 DIAGNOSIS — Z7952 Long term (current) use of systemic steroids: Secondary | ICD-10-CM | POA: Diagnosis not present

## 2015-09-11 DIAGNOSIS — C3490 Malignant neoplasm of unspecified part of unspecified bronchus or lung: Secondary | ICD-10-CM | POA: Diagnosis present

## 2015-09-11 DIAGNOSIS — G4700 Insomnia, unspecified: Secondary | ICD-10-CM | POA: Diagnosis present

## 2015-09-11 DIAGNOSIS — J13 Pneumonia due to Streptococcus pneumoniae: Secondary | ICD-10-CM

## 2015-09-11 DIAGNOSIS — E860 Dehydration: Secondary | ICD-10-CM | POA: Diagnosis present

## 2015-09-11 DIAGNOSIS — F17200 Nicotine dependence, unspecified, uncomplicated: Secondary | ICD-10-CM | POA: Diagnosis present

## 2015-09-11 DIAGNOSIS — E876 Hypokalemia: Secondary | ICD-10-CM | POA: Diagnosis present

## 2015-09-11 DIAGNOSIS — T380X5A Adverse effect of glucocorticoids and synthetic analogues, initial encounter: Secondary | ICD-10-CM | POA: Diagnosis present

## 2015-09-11 DIAGNOSIS — L89213 Pressure ulcer of right hip, stage 3: Secondary | ICD-10-CM | POA: Diagnosis present

## 2015-09-11 DIAGNOSIS — Z66 Do not resuscitate: Secondary | ICD-10-CM | POA: Diagnosis present

## 2015-09-11 DIAGNOSIS — Z885 Allergy status to narcotic agent status: Secondary | ICD-10-CM | POA: Diagnosis not present

## 2015-09-11 DIAGNOSIS — Z888 Allergy status to other drugs, medicaments and biological substances status: Secondary | ICD-10-CM | POA: Diagnosis not present

## 2015-09-11 DIAGNOSIS — Z6827 Body mass index (BMI) 27.0-27.9, adult: Secondary | ICD-10-CM | POA: Diagnosis not present

## 2015-09-11 DIAGNOSIS — D696 Thrombocytopenia, unspecified: Secondary | ICD-10-CM | POA: Diagnosis present

## 2015-09-11 DIAGNOSIS — D72829 Elevated white blood cell count, unspecified: Secondary | ICD-10-CM | POA: Diagnosis present

## 2015-09-11 DIAGNOSIS — Z7982 Long term (current) use of aspirin: Secondary | ICD-10-CM | POA: Diagnosis not present

## 2015-09-11 DIAGNOSIS — L89151 Pressure ulcer of sacral region, stage 1: Secondary | ICD-10-CM | POA: Diagnosis present

## 2015-09-11 DIAGNOSIS — J9601 Acute respiratory failure with hypoxia: Secondary | ICD-10-CM | POA: Diagnosis present

## 2015-09-11 DIAGNOSIS — G9340 Encephalopathy, unspecified: Secondary | ICD-10-CM | POA: Diagnosis present

## 2015-09-11 DIAGNOSIS — F319 Bipolar disorder, unspecified: Secondary | ICD-10-CM | POA: Diagnosis present

## 2015-09-11 DIAGNOSIS — R911 Solitary pulmonary nodule: Secondary | ICD-10-CM | POA: Diagnosis not present

## 2015-09-11 DIAGNOSIS — Z515 Encounter for palliative care: Secondary | ICD-10-CM | POA: Diagnosis present

## 2015-09-11 DIAGNOSIS — J939 Pneumothorax, unspecified: Secondary | ICD-10-CM | POA: Diagnosis present

## 2015-09-11 DIAGNOSIS — C7931 Secondary malignant neoplasm of brain: Secondary | ICD-10-CM | POA: Diagnosis present

## 2015-09-11 DIAGNOSIS — D6489 Other specified anemias: Secondary | ICD-10-CM | POA: Diagnosis present

## 2015-09-11 DIAGNOSIS — I10 Essential (primary) hypertension: Secondary | ICD-10-CM | POA: Diagnosis present

## 2015-09-11 DIAGNOSIS — M25572 Pain in left ankle and joints of left foot: Secondary | ICD-10-CM | POA: Diagnosis not present

## 2015-09-11 DIAGNOSIS — D630 Anemia in neoplastic disease: Secondary | ICD-10-CM | POA: Diagnosis present

## 2015-09-11 DIAGNOSIS — J449 Chronic obstructive pulmonary disease, unspecified: Secondary | ICD-10-CM | POA: Diagnosis present

## 2015-09-11 DIAGNOSIS — I76 Septic arterial embolism: Secondary | ICD-10-CM | POA: Diagnosis present

## 2015-09-11 DIAGNOSIS — K1230 Oral mucositis (ulcerative), unspecified: Secondary | ICD-10-CM | POA: Diagnosis present

## 2015-09-11 DIAGNOSIS — R21 Rash and other nonspecific skin eruption: Secondary | ICD-10-CM | POA: Diagnosis present

## 2015-09-11 DIAGNOSIS — F17210 Nicotine dependence, cigarettes, uncomplicated: Secondary | ICD-10-CM | POA: Diagnosis present

## 2015-09-11 DIAGNOSIS — Z79899 Other long term (current) drug therapy: Secondary | ICD-10-CM | POA: Diagnosis not present

## 2015-09-11 DIAGNOSIS — E46 Unspecified protein-calorie malnutrition: Secondary | ICD-10-CM | POA: Diagnosis present

## 2015-09-11 DIAGNOSIS — Z79891 Long term (current) use of opiate analgesic: Secondary | ICD-10-CM | POA: Diagnosis not present

## 2015-09-11 DIAGNOSIS — Z7984 Long term (current) use of oral hypoglycemic drugs: Secondary | ICD-10-CM | POA: Diagnosis not present

## 2015-09-11 DIAGNOSIS — L89523 Pressure ulcer of left ankle, stage 3: Secondary | ICD-10-CM | POA: Diagnosis present

## 2015-09-11 DIAGNOSIS — A419 Sepsis, unspecified organism: Secondary | ICD-10-CM | POA: Diagnosis present

## 2015-09-11 LAB — COMPREHENSIVE METABOLIC PANEL
ALBUMIN: 2.1 g/dL — AB (ref 3.5–5.0)
ALK PHOS: 84 U/L (ref 38–126)
ALT: 25 U/L (ref 17–63)
ANION GAP: 8 (ref 5–15)
AST: 24 U/L (ref 15–41)
BILIRUBIN TOTAL: 0.8 mg/dL (ref 0.3–1.2)
BUN: 46 mg/dL — AB (ref 6–20)
CALCIUM: 7.8 mg/dL — AB (ref 8.9–10.3)
CO2: 27 mmol/L (ref 22–32)
CREATININE: 1.62 mg/dL — AB (ref 0.61–1.24)
Chloride: 109 mmol/L (ref 101–111)
GFR calc Af Amer: 57 mL/min — ABNORMAL LOW (ref >60)
GFR calc non Af Amer: 49 mL/min — ABNORMAL LOW (ref >60)
GLUCOSE: 163 mg/dL — AB (ref 65–99)
Potassium: 3.8 mmol/L (ref 3.5–5.1)
Sodium: 144 mmol/L (ref 135–145)
TOTAL PROTEIN: 5.5 g/dL — AB (ref 6.5–8.1)

## 2015-09-11 LAB — URINALYSIS, ROUTINE W REFLEX MICROSCOPIC
BILIRUBIN URINE: NEGATIVE
Glucose, UA: 500 mg/dL — AB
KETONES UR: NEGATIVE mg/dL
Leukocytes, UA: NEGATIVE
NITRITE: NEGATIVE
PROTEIN: NEGATIVE mg/dL
SPECIFIC GRAVITY, URINE: 1.016 (ref 1.005–1.030)
UROBILINOGEN UA: 1 mg/dL (ref 0.0–1.0)
pH: 5.5 (ref 5.0–8.0)

## 2015-09-11 LAB — BLOOD GAS, VENOUS
ACID-BASE DEFICIT: 0.1 mmol/L (ref 0.0–2.0)
BICARBONATE: 25 meq/L — AB (ref 20.0–24.0)
O2 CONTENT: 2 L/min
O2 Saturation: 70.7 %
PATIENT TEMPERATURE: 98.5
TCO2: 24 mmol/L (ref 0–100)
pCO2, Ven: 46.1 mmHg (ref 45.0–50.0)
pH, Ven: 7.354 — ABNORMAL HIGH (ref 7.250–7.300)
pO2, Ven: 41.4 mmHg (ref 30.0–45.0)

## 2015-09-11 LAB — GLUCOSE, CAPILLARY
GLUCOSE-CAPILLARY: 169 mg/dL — AB (ref 65–99)
GLUCOSE-CAPILLARY: 197 mg/dL — AB (ref 65–99)
GLUCOSE-CAPILLARY: 157 mg/dL — AB (ref 65–99)
Glucose-Capillary: 165 mg/dL — ABNORMAL HIGH (ref 65–99)
Glucose-Capillary: 133 mg/dL — ABNORMAL HIGH (ref 65–99)
Glucose-Capillary: 157 mg/dL — ABNORMAL HIGH (ref 65–99)

## 2015-09-11 LAB — MRSA PCR SCREENING: MRSA BY PCR: NEGATIVE

## 2015-09-11 LAB — TROPONIN I
Troponin I: <0.03 ng/mL (ref <0.031)
Troponin I: <0.03 ng/mL (ref <0.031)

## 2015-09-11 LAB — LACTIC ACID, PLASMA
LACTIC ACID, VENOUS: 1.1 mmol/L (ref 0.5–2.0)
Lactic Acid, Venous: 1.1 mmol/L (ref 0.5–2.0)
Lactic Acid, Venous: 0.9 mmol/L (ref 0.5–2.0)

## 2015-09-11 LAB — URINE MICROSCOPIC-ADD ON

## 2015-09-11 LAB — STREP PNEUMONIAE URINARY ANTIGEN: Strep Pneumo Urinary Antigen: POSITIVE — AB

## 2015-09-11 MED ORDER — GUAIFENESIN ER 600 MG PO TB12
1200.0000 mg | ORAL_TABLET | Freq: Two times a day (BID) | ORAL | Status: DC
  Administered 2015-09-11 – 2015-09-14 (×7): 1200 mg via ORAL
  Filled 2015-09-11 (×7): qty 2

## 2015-09-11 MED ORDER — DOPAMINE-DEXTROSE 3.2-5 MG/ML-% IV SOLN: 0.0000 ug/kg/min | Freq: Once | INTRAVENOUS | Status: DC

## 2015-09-11 MED ORDER — ALBUTEROL SULFATE HFA 108 (90 BASE) MCG/ACT IN AERS: 2.0000 | INHALATION_SPRAY | Freq: Four times a day (QID) | RESPIRATORY_TRACT | Status: DC

## 2015-09-11 MED ORDER — CLONAZEPAM 0.5 MG PO TABS
0.5000 mg | ORAL_TABLET | Freq: Three times a day (TID) | ORAL | Status: DC | PRN
  Administered 2015-09-12 – 2015-09-14 (×6): 0.5 mg via ORAL
  Filled 2015-09-11 (×7): qty 1

## 2015-09-11 MED ORDER — AMITRIPTYLINE HCL 25 MG PO TABS
25.0000 mg | ORAL_TABLET | Freq: Every day | ORAL | Status: DC
  Administered 2015-09-11 – 2015-09-13 (×4): 25 mg via ORAL
  Filled 2015-09-11 (×4): qty 1

## 2015-09-11 MED ORDER — OSELTAMIVIR PHOSPHATE 75 MG PO CAPS
75.0000 mg | ORAL_CAPSULE | Freq: Two times a day (BID) | ORAL | Status: DC
  Administered 2015-09-11 – 2015-09-13 (×6): 75 mg via ORAL
  Filled 2015-09-11 (×7): qty 1

## 2015-09-11 MED ORDER — QUETIAPINE FUMARATE 100 MG PO TABS
400.0000 mg | ORAL_TABLET | Freq: Every day | ORAL | Status: DC
  Administered 2015-09-11 – 2015-09-13 (×4): 400 mg via ORAL
  Filled 2015-09-11 (×4): qty 4

## 2015-09-11 MED ORDER — ALBUTEROL SULFATE (2.5 MG/3ML) 0.083% IN NEBU: 2.5000 mg | INHALATION_SOLUTION | RESPIRATORY_TRACT | Status: DC | PRN

## 2015-09-11 MED ORDER — OXYCODONE HCL ER 10 MG PO T12A: 20.0000 mg | EXTENDED_RELEASE_TABLET | Freq: Two times a day (BID) | ORAL | Status: DC

## 2015-09-11 MED ORDER — CHLORHEXIDINE GLUCONATE 0.12 % MT SOLN
15.0000 mL | Freq: Two times a day (BID) | OROMUCOSAL | Status: DC
  Administered 2015-09-11 – 2015-09-14 (×6): 15 mL via OROMUCOSAL
  Filled 2015-09-11 (×3): qty 15

## 2015-09-11 MED ORDER — ASPIRIN EC 325 MG PO TBEC
325.0000 mg | DELAYED_RELEASE_TABLET | Freq: Every day | ORAL | Status: DC
  Administered 2015-09-11: 325 mg via ORAL
  Filled 2015-09-11 (×2): qty 1

## 2015-09-11 MED ORDER — ALBUTEROL SULFATE HFA 108 (90 BASE) MCG/ACT IN AERS: 2.0000 | INHALATION_SPRAY | RESPIRATORY_TRACT | Status: DC | PRN

## 2015-09-11 MED ORDER — IPRATROPIUM-ALBUTEROL 0.5-2.5 (3) MG/3ML IN SOLN
3.0000 mL | Freq: Four times a day (QID) | RESPIRATORY_TRACT | Status: DC
  Administered 2015-09-11 – 2015-09-14 (×9): 3 mL via RESPIRATORY_TRACT
  Filled 2015-09-11 (×9): qty 3

## 2015-09-11 MED ORDER — SODIUM CHLORIDE 0.9 % IV SOLN
250.0000 mL | INTRAVENOUS | Status: DC
  Administered 2015-09-11: 1000 mL via INTRAVENOUS
  Administered 2015-09-11: 250 mL via INTRAVENOUS

## 2015-09-11 MED ORDER — FAMOTIDINE IN NACL 20-0.9 MG/50ML-% IV SOLN
20.0000 mg | Freq: Two times a day (BID) | INTRAVENOUS | Status: DC
Start: 2015-09-11 — End: 2015-09-11
  Administered 2015-09-11 (×2): 20 mg via INTRAVENOUS
  Filled 2015-09-11 (×2): qty 50

## 2015-09-11 MED ORDER — POLYETHYLENE GLYCOL 3350 17 G PO PACK
17.0000 g | PACK | Freq: Two times a day (BID) | ORAL | Status: DC | PRN
  Filled 2015-09-11: qty 1

## 2015-09-11 MED ORDER — CETYLPYRIDINIUM CHLORIDE 0.05 % MT LIQD
7.0000 mL | Freq: Two times a day (BID) | OROMUCOSAL | Status: DC
  Administered 2015-09-12 – 2015-09-13 (×4): 7 mL via OROMUCOSAL

## 2015-09-11 MED ORDER — FENTANYL CITRATE (PF) 100 MCG/2ML IJ SOLN
50.0000 ug | INTRAMUSCULAR | Status: DC | PRN
  Administered 2015-09-11 – 2015-09-14 (×7): 50 ug via INTRAVENOUS
  Filled 2015-09-11 (×7): qty 2

## 2015-09-11 MED ORDER — HYDROCORTISONE NA SUCCINATE PF 100 MG IJ SOLR
50.0000 mg | Freq: Four times a day (QID) | INTRAMUSCULAR | Status: DC
  Administered 2015-09-11 – 2015-09-13 (×10): 50 mg via INTRAVENOUS
  Filled 2015-09-11 (×10): qty 2

## 2015-09-11 MED ORDER — NOREPINEPHRINE BITARTRATE 1 MG/ML IV SOLN
0.0000 ug/min | INTRAVENOUS | Status: DC
  Administered 2015-09-11: 14 ug/min via INTRAVENOUS
  Administered 2015-09-11: 2 ug/min via INTRAVENOUS
  Administered 2015-09-11: 18 ug/min via INTRAVENOUS
  Administered 2015-09-12: 7 ug/min via INTRAVENOUS
  Administered 2015-09-13: 4 ug/min via INTRAVENOUS
  Filled 2015-09-11 (×8): qty 4

## 2015-09-11 MED ORDER — CHLORHEXIDINE GLUCONATE 0.12 % MT SOLN
OROMUCOSAL | Status: AC
  Filled 2015-09-11: qty 15

## 2015-09-11 MED ORDER — IPRATROPIUM BROMIDE HFA 17 MCG/ACT IN AERS: 2.0000 | INHALATION_SPRAY | Freq: Four times a day (QID) | RESPIRATORY_TRACT | Status: DC

## 2015-09-11 MED ORDER — IPRATROPIUM-ALBUTEROL 0.5-2.5 (3) MG/3ML IN SOLN
3.0000 mL | Freq: Four times a day (QID) | RESPIRATORY_TRACT | Status: DC
  Administered 2015-09-11: 3 mL via RESPIRATORY_TRACT
  Filled 2015-09-11: qty 3

## 2015-09-11 MED ORDER — ALBUTEROL SULFATE (2.5 MG/3ML) 0.083% IN NEBU
2.5000 mg | INHALATION_SOLUTION | Freq: Four times a day (QID) | RESPIRATORY_TRACT | Status: DC
  Administered 2015-09-11: 2.5 mg via RESPIRATORY_TRACT
  Filled 2015-09-11: qty 3

## 2015-09-11 MED ORDER — INSULIN ASPART 100 UNIT/ML ~~LOC~~ SOLN
2.0000 [IU] | SUBCUTANEOUS | Status: DC
  Administered 2015-09-11 (×2): 4 [IU] via SUBCUTANEOUS
  Administered 2015-09-11: 2 [IU] via SUBCUTANEOUS

## 2015-09-11 MED ORDER — INSULIN ASPART 100 UNIT/ML ~~LOC~~ SOLN: 0.0000 [IU] | Freq: Every day | SUBCUTANEOUS | Status: DC

## 2015-09-11 MED ORDER — IPRATROPIUM BROMIDE 0.02 % IN SOLN
0.5000 mg | Freq: Four times a day (QID) | RESPIRATORY_TRACT | Status: DC
  Administered 2015-09-11: 0.5 mg via RESPIRATORY_TRACT
  Filled 2015-09-11: qty 2.5

## 2015-09-11 MED ORDER — VANCOMYCIN HCL IN DEXTROSE 1-5 GM/200ML-% IV SOLN
1000.0000 mg | Freq: Two times a day (BID) | INTRAVENOUS | Status: DC
  Administered 2015-09-11 – 2015-09-13 (×5): 1000 mg via INTRAVENOUS
  Filled 2015-09-11 (×4): qty 200

## 2015-09-11 MED ORDER — SODIUM CHLORIDE 0.9 % IV SOLN: 250.0000 mL | INTRAVENOUS | Status: DC | PRN

## 2015-09-11 MED ORDER — INSULIN ASPART 100 UNIT/ML ~~LOC~~ SOLN
0.0000 [IU] | Freq: Three times a day (TID) | SUBCUTANEOUS | Status: DC
Start: 2015-09-11 — End: 2015-09-14
  Administered 2015-09-11: 3 [IU] via SUBCUTANEOUS
  Administered 2015-09-12 (×2): 2 [IU] via SUBCUTANEOUS
  Administered 2015-09-12: 3 [IU] via SUBCUTANEOUS
  Administered 2015-09-13 (×2): 2 [IU] via SUBCUTANEOUS
  Administered 2015-09-14: 3 [IU] via SUBCUTANEOUS

## 2015-09-11 NOTE — Progress Notes (Signed)
ANTIBIOTIC CONSULT NOTE - BRIEF NOTE  Pharmacy Consult for Vancomycin & Zosyn Indication: pneumonia  Renal function has improved from time of admission following fluid resuscitation.   Plan:  Change vancomycin to 1gm IV q12h  Continue zosyn 3.375gm IV q8h extended infusion as ordered  Doreene Eland, PharmD, BCPS.   Pager: [phone] 09/11/2015 10:10 AM

## 2015-09-11 NOTE — ED Notes (Signed)
Attempted to call report

## 2015-09-11 NOTE — Progress Notes (Signed)
Utilization Review Completed.Donne Anon T11/04/2015

## 2015-09-11 NOTE — H&P (Signed)
PULMONARY / CRITICAL CARE MEDICINE HISTORY AND PHYSICAL EXAMINATION   Name: Glen Mendez MRN: [id number] DOB: 1966/03/05    ADMISSION DATE:  09/10/2015  PRIMARY SERVICE: PCCM  CHIEF COMPLAINT:  Shock  BRIEF PATIENT DESCRIPTION: 5 M with Stage 4 lung Ca, COPD, DM, BPD presenting with AMS. Hypotensive in ED. Given 5 L IVF and then started on pressors. PCCM asked to admit.  SIGNIFICANT EVENTS / STUDIES:  Lactate 2.31 on admission  LINES / TUBES: Port  CULTURES: Blood Cultures x 2, 11/5  Urine Culture, 11/5 Sputum Culture, 11/5 RVP, 11/5  ANTIBIOTICS: Vanc 11/5 - Zosyn 11/5 - Tamiflu 11/5 -  HISTORY OF PRESENT ILLNESS:  Glen Mendez is a 75 M with Stage 4 lung Ca, COPD, DM, BPD who presented with AMS following a cruise. He is currently able to provide history. He started feeling ill at the end of a recent cruise with severe fatigue and weakness. His wife earlier this evening reported AMS as well. He notes worsening cough recently but he is not producing sputum. He denies abdominal pain.   PAST MEDICAL HISTORY :  Past Medical History  Diagnosis Date  . Lung cancer ([place])   . Bipolar disorder ([place])   . COPD (chronic obstructive pulmonary disease) ([place])   . Hypertension   . Diabetes mellitus without complication ([place])   . Brain metastases ([place])   . Radiation     [facility], 10 days to brain, and 6 weeks to chest by Dr. Roxanne Mins   No past surgical history on file. Prior to Admission medications   Medication Sig Start Date End Date Taking? Authorizing Provider  albuterol (PROVENTIL) (5 MG/ML) 0.5% nebulizer solution Take 2.5 mg by nebulization every 4 (four) hours as needed for wheezing or shortness of breath.   Yes Historical Provider, MD  amitriptyline (ELAVIL) 25 MG tablet Take 25 mg by mouth at bedtime.   Yes Historical Provider, MD  aspirin 325 MG EC tablet Take 325 mg by mouth daily.   Yes Historical Provider, MD  bisacodyl (DULCOLAX) 10 MG  suppository Place 1 suppository (10 mg total) rectally daily as needed for severe constipation. 07/18/15  Yes Debbe Odea, MD  carvedilol (COREG) 25 MG tablet Take 25 mg by mouth 2 (two) times daily. 07/21/15  Yes Historical Provider, MD  clonazePAM (KLONOPIN) 1 MG tablet Take 0.5-1 tablets (0.5-1 mg total) by mouth 3 (three) times daily as needed for anxiety. 07/18/15  Yes Debbe Odea, MD  dexamethasone (DECADRON) 4 MG tablet Take 2 tablets TID with food. Taper per instructions. Patient taking differently: Take 4 mg by mouth 2 (two) times daily. Take 2 tablets TID with food. Taper per instructions. 08/08/15  Yes Eppie Gibson, MD  docusate sodium (COLACE) 100 MG capsule Take 2 capsules (200 mg total) by mouth at bedtime. 07/18/15  Yes Debbe Odea, MD  fluconazole (DIFLUCAN) 100 MG tablet Take 2 tablets today, then 1 tablet daily x 20 more days. 08/03/15  Yes Eppie Gibson, MD  fluticasone [facility]) 50 MCG/ACT nasal spray Place 1 spray into the nose.   Yes Historical Provider, MD  furosemide (LASIX) 20 MG tablet Take 2 tablets (40 mg total) by mouth 2 (two) times daily. 08/29/15  Yes Curt Bears, MD  ipratropium (ATROVENT HFA) 17 MCG/ACT inhaler Inhale 2 puffs into the lungs every 4 (four) hours as needed.  02/07/15  Yes Historical Provider, MD  metFORMIN (GLUCOPHAGE) 500 MG tablet Take 500 mg by mouth daily.   Yes Historical  Provider, MD  ondansetron (ZOFRAN) 8 MG tablet Take 8 mg by mouth daily as needed for nausea.    Yes Historical Provider, MD  OxyCODONE (OXYCONTIN) 20 mg T12A 12 hr tablet Take 1 tablet (20 mg total) by mouth every 12 (twelve) hours. 08/22/15  Yes Curt Bears, MD  Oxycodone HCl 10 MG TABS Take 1 tablet every 6 hours prn breakthrough pain 08/22/15  Yes Curt Bears, MD  polyethylene glycol (MIRALAX / GLYCOLAX) packet Take 17 g by mouth 2 (two) times daily as needed for mild constipation. 07/18/15  Yes Debbe Odea, MD  QUEtiapine (SEROQUEL) 400 MG tablet Take 400 mg by mouth at  bedtime.   Yes Historical Provider, MD  ranitidine (ZANTAC) 150 MG tablet Take 150 mg by mouth 2 (two) times daily.   Yes Historical Provider, MD  traZODone (DESYREL) 50 MG tablet Take 100 mg by mouth. 07/30/14  Yes Historical Provider, MD  zolpidem (AMBIEN) 10 MG tablet TAKE 1 TABLET EVERYDAY AT BEDTIME ONLY 08/12/15  Yes Historical Provider, MD  glipiZIDE (GLUCOTROL) 5 MG tablet Take 1 tablet (5 mg total) by mouth daily before breakfast. 07/18/15   Debbe Odea, MD   Allergies  Allergen Reactions  . Prilosec [Omeprazole] Hives and Itching  . Vicodin [Hydrocodone-Acetaminophen]     " makes me itch" i can take it if I take bendryl    FAMILY HISTORY:  Family History  Problem Relation Age of Onset  . Adopted: Yes  . Family history unknown: Yes   SOCIAL HISTORY:  reports that he has been smoking Cigarettes.  He has a 15 pack-year smoking history. He has never used smokeless tobacco. He reports that he does not drink alcohol or use illicit drugs.  REVIEW OF SYSTEMS:  A 12-system ROS was conducted and, unless otherwise specified in the HPI, was negative.   SUBJECTIVE:   VITAL SIGNS: Temp:  [98.9 F (37.2 C)] 98.9 F (37.2 C) (11/05 1917) Pulse Rate:  [84-109] 90 (11/06 0013) Resp:  [13-25] 18 (11/06 0013) BP: (72-89)/(40-57) 78/44 mmHg (11/06 0013) SpO2:  [86 %-100 %] 100 % (11/06 0013) Weight:  [208 lb (94.348 kg)] 208 lb (94.348 kg) (11/05 1917) HEMODYNAMICS:   VENTILATOR SETTINGS:   INTAKE / OUTPUT: Intake/Output      11/05 0701 - 11/06 0700   P.O. 240   I.V. (mL/kg) 2000 (21.2)   Total Intake(mL/kg) 2240 (23.7)   Net +2240         PHYSICAL EXAMINATION: General:  Pale M in NAD Neuro:  A+O x 3, moves all extremities HEENT:  Cigarette burn on left mouth, Sclera anicteric, conjunctiva pink, MM Dry Neck: Trachea supple and midline, (-) LAN Cardiovascular:  RRR, nS1/S2, (-) MRG Lungs:  Decreased BS bilaterally Abdomen:  S/NT/ND/(+)BS Musculoskeletal:  (-) C/C/E Skin:   Diffuse scabs on LE bilaterally  LABS:  CBC  Recent Labs Lab 09/10/15 1915  WBC 7.7  HGB 12.6*  HCT 38.4*  PLT 41*   Coag's No results for input(s): APTT, INR in the last 168 hours. BMET  Recent Labs Lab 09/10/15 1915  NA 146*  K 4.2  CL 104  CO2 29  BUN 75*  CREATININE 2.85*  GLUCOSE 138*   Electrolytes  Recent Labs Lab 09/10/15 1915  CALCIUM 9.3   Sepsis Markers  Recent Labs Lab 09/10/15 2021 09/10/15 2242  LATICACIDVEN 2.31* 0.74   ABG No results for input(s): PHART, PCO2ART, PO2ART in the last 168 hours. Liver Enzymes  Recent Labs Lab 09/10/15 1915  AST 25  ALT 27  ALKPHOS 93  BILITOT 1.1  ALBUMIN 2.6*   Cardiac Enzymes No results for input(s): TROPONINI, PROBNP in the last 168 hours. Glucose  Recent Labs Lab 09/10/15 1913  GLUCAP 132*    Imaging Dg Chest 2 View  09/10/2015  CLINICAL DATA:  Patient returned from 4 days cruise, dehydrated, weakness, and altered mental status. Pt cool to touch, pale, having difficulty speaking. Hx lung cancer, copd, htn, diabetic, smoker. EXAM: CHEST  2 VIEW COMPARISON:  CT, 08/18/2015 and chest radiograph, 07/17/2015. FINDINGS: There is patchy airspace opacity in the left mid. There is a round area of focal opacity in the right lung base. These findings are new from the prior exam. There is persistent volume loss on the right right upper lung zone parenchymal opacity and pleural thickening consistent with treatment related scarring. Cardiac silhouette is normal in size and configuration. No mediastinal or convincing left hilar mass or adenopathy. There is a small lateral pneumothorax, less than 5%, along the mid to lower aspect of the left hemi thorax. No right pneumothorax. No pleural effusion. Right anterior chest wall power Port-A-Cath is stable with its tip at the caval atrial junction. IMPRESSION: 1. New patchy airspace opacity in the left mid and lower lung consistent with pneumonia. 2. 2.3 cm nodular type  opacity at the right lung base. This was not present on the recent prior chest radiograph. There were 2 nodules in this location on the prior CT. This could reflect an enlarged area neoplastic/metastatic disease. It may be inflammatory/infectious in etiology. 3. Small, less than 5%, left lateral pneumothorax. Electronically Signed   By: Lajean Manes M.D.   On: 09/10/2015 20:01    EKG: Personally Reviewed. No acute ST elevations CXR: Personally reviewed.   ASSESSMENT / PLAN:  Active Problems:   * No active hospital problems. *   PULMONARY A: COPD: No evidence of flare currently. L Pneumothorax: Likely 2/2 bleb rupture P:   Standing and PRN nebs Repeat RX-ray  CARDIOVASCULAR A: Shock: Almost certainly septic. Hypotension has not resolved in spite of 4 L of IVF.  HTN P:   Serial Trops and Lactates Stress dose steroids given recent decadron use Hold home antihypertensives for now  RENAL A: AKI on CKD: Almost certainly 2/2 sepsis. Hypernatremia: P:   Serial BMPs  GASTROINTESTINAL A: No acute issues:  P:   Continue home H2 Blocker  HEMATOLOGIC A: Anemia: Chronic Thrombocytopenia: Persistent since 08/2015 P:   Monitor  INFECTIOUS A: Presumed Pneumonia:  P:   Cont Empiric Vanc/Zosyn/Tamiflu Narrow based on culture data  ENDOCRINE A: DM:  P:   SSI  NEUROLOGIC A: AMS: Improved based on conversation with staff. Likely 2/2 sepsis. BPD:  P:   Continue home bipolar and pain control regimen  BEST PRACTICE / DISPOSITION Level of Care:  ICU Primary Service:  PCCM Consultants:  None Code Status:  Full, Discussed at Length with patient  Diet:  NPO DVT Px:  Lovenox GI Px:  H2 Blocker Skin Integrity:  As Above Social / Family:  Not updated  TODAY'S SUMMARY:   I have personally obtained a history, examined the patient, evaluated laboratory and imaging results, formulated the assessment and plan and placed orders.  CRITICAL CARE: The patient is critically  ill with multiple organ systems failure and requires high complexity decision making for assessment and support, frequent evaluation and titration of therapies, application of advanced monitoring technologies and extensive interpretation of multiple databases. Critical Care Time devoted to  patient care services described in this note is 45 minutes.   Margarette Asal, MD Pulmonary and [place] Pager: [phone]   09/11/2015, 12:33 AM

## 2015-09-11 NOTE — ED Provider Notes (Signed)
CSN: [id number]     Arrival date & time 09/10/15  1903 History   First MD Initiated Contact with Patient 09/10/15 1918     Chief Complaint  Patient presents with  . Hypotension  . Lung Cancer    Mets     (Consider location/radiation/quality/duration/timing/severity/associated sxs/prior Treatment) HPI Patient presents to the emergency department with profound dehydration.  The patient went on a cruise once he found out that his terminal cancer would no longer be treated.  His wife states that he laid in the cabin on the cruise ship the entire time and barely ate or drank.  Patient states he feels very lethargic, fatigued and dehydrated.  Patient is not able to answer all questions because he is lethargic.  The patient did deny chest pain, shortness of breath, headache, blurred vision, back pain, neck pain, incontinence, bloody stool, hematemesis or loss of consciousness Past Medical History  Diagnosis Date  . Lung cancer ([place])   . Bipolar disorder ([place])   . COPD (chronic obstructive pulmonary disease) ([place])   . Hypertension   . Diabetes mellitus without complication ([place])   . Brain metastases ([place])   . Radiation     [facility], 10 days to brain, and 6 weeks to chest by Dr. Roxanne Mins   No past surgical history on file. Family History  Problem Relation Age of Onset  . Adopted: Yes  . Family history unknown: Yes   Social History  Substance Use Topics  . Smoking status: Current Every Day Smoker -- 0.50 packs/day for 30 years    Types: Cigarettes  . Smokeless tobacco: Never Used  . 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 Authorizing Provider  albuterol (PROVENTIL) (5 MG/ML) 0.5% nebulizer solution Take 2.5 mg by nebulization every 4  (four) hours as needed for wheezing or shortness of breath.   Yes Historical Provider, MD  amitriptyline (ELAVIL) 25 MG tablet Take 25 mg by mouth at bedtime.   Yes Historical Provider, MD  aspirin 325 MG EC tablet Take 325 mg by mouth daily.   Yes Historical Provider, MD  bisacodyl (DULCOLAX) 10 MG suppository Place 1 suppository (10 mg total) rectally daily as needed for severe constipation. 07/18/15  Yes Debbe Odea, MD  carvedilol (COREG) 25 MG tablet Take 25 mg by mouth 2 (two) times daily. 07/21/15  Yes Historical Provider, MD  clonazePAM (KLONOPIN) 1 MG tablet Take 0.5-1 tablets (0.5-1 mg total) by mouth 3 (three) times daily as needed for anxiety. 07/18/15  Yes Debbe Odea, MD  dexamethasone (DECADRON) 4 MG tablet Take 2 tablets TID with food. Taper per instructions. Patient taking differently: Take 4 mg by mouth 2 (two) times daily. Take 2 tablets TID with food. Taper per instructions. 08/08/15  Yes Eppie Gibson, MD  docusate sodium (COLACE) 100 MG capsule Take 2 capsules (200 mg total) by mouth at bedtime. 07/18/15  Yes Debbe Odea, MD  fluconazole (DIFLUCAN) 100 MG tablet Take 2 tablets today, then 1 tablet daily x 20 more days. 08/03/15  Yes Eppie Gibson, MD  fluticasone [facility]) 50 MCG/ACT nasal spray Place 1 spray into the nose.   Yes Historical Provider, MD  furosemide (LASIX) 20 MG tablet Take 2 tablets (40 mg total)  by mouth 2 (two) times daily. 08/29/15  Yes Curt Bears, MD  ipratropium (ATROVENT HFA) 17 MCG/ACT inhaler Inhale 2 puffs into the lungs every 4 (four) hours as needed.  02/07/15  Yes Historical Provider, MD  metFORMIN (GLUCOPHAGE) 500 MG tablet Take 500 mg by mouth daily.   Yes Historical Provider, MD  ondansetron (ZOFRAN) 8 MG tablet Take 8 mg by mouth daily as needed for nausea.    Yes Historical Provider, MD  OxyCODONE (OXYCONTIN) 20 mg T12A 12 hr tablet Take 1 tablet (20 mg total) by mouth every 12 (twelve) hours. 08/22/15  Yes Curt Bears, MD  Oxycodone HCl 10  MG TABS Take 1 tablet every 6 hours prn breakthrough pain 08/22/15  Yes Curt Bears, MD  polyethylene glycol (MIRALAX / GLYCOLAX) packet Take 17 g by mouth 2 (two) times daily as needed for mild constipation. 07/18/15  Yes Debbe Odea, MD  QUEtiapine (SEROQUEL) 400 MG tablet Take 400 mg by mouth at bedtime.   Yes Historical Provider, MD  ranitidine (ZANTAC) 150 MG tablet Take 150 mg by mouth 2 (two) times daily.   Yes Historical Provider, MD  traZODone (DESYREL) 50 MG tablet Take 100 mg by mouth. 07/30/14  Yes Historical Provider, MD  zolpidem (AMBIEN) 10 MG tablet TAKE 1 TABLET EVERYDAY AT BEDTIME ONLY 08/12/15  Yes Historical Provider, MD  glipiZIDE (GLUCOTROL) 5 MG tablet Take 1 tablet (5 mg total) by mouth daily before breakfast. 07/18/15   Debbe Odea, MD   BP 78/44 mmHg  Pulse 90  Temp(Src) 98.9 F (37.2 C) (Rectal)  Resp 18  Ht '6\' 1"'$  (1.854 m)  Wt 208 lb (94.348 kg)  BMI 27.45 kg/m2  SpO2 100% Physical Exam  Constitutional: He is oriented to person, place, and time. He appears well-developed and well-nourished. He appears lethargic. He has a sickly appearance. He appears ill.  HENT:  Head: Normocephalic and atraumatic.  Mouth/Throat: Oropharynx is clear and moist.  Eyes: Pupils are equal, round, and reactive to light.  Neck: Normal range of motion. Neck supple.  Cardiovascular: Regular rhythm and normal heart sounds.  Tachycardia present.  Exam reveals no gallop and no friction rub.   No murmur heard. Pulmonary/Chest: Effort normal and breath sounds normal. No respiratory distress. He has no wheezes.  Abdominal: Soft. Bowel sounds are normal. He exhibits no distension. There is no tenderness. There is no guarding.  Neurological: He is oriented to person, place, and time. He appears lethargic. He exhibits normal muscle tone. Coordination normal.  Skin: Skin is warm and dry. No rash noted. No erythema.  Psychiatric: He has a normal mood and affect. His behavior is normal.   Nursing note and vitals reviewed.   ED Course  Procedures (including critical care time) Labs Review Labs Reviewed  COMPREHENSIVE METABOLIC PANEL - Abnormal; Notable for the following:    Sodium 146 (*)    Glucose, Bld 138 (*)    BUN 75 (*)    Creatinine, Ser 2.85 (*)    Albumin 2.6 (*)    GFR calc non Af Amer 25 (*)    GFR calc Af Amer 29 (*)    All other components within normal limits  CBC WITH DIFFERENTIAL/PLATELET - Abnormal; Notable for the following:    RBC 3.72 (*)    Hemoglobin 12.6 (*)    HCT 38.4 (*)    MCV 103.2 (*)    RDW 16.7 (*)    Platelets 41 (*)    Lymphs Abs 0.3 (*)  All other components within normal limits  CBG MONITORING, ED - Abnormal; Notable for the following:    Glucose-Capillary 132 (*)    All other components within normal limits  I-STAT CG4 LACTIC ACID, ED - Abnormal; Notable for the following:    Lactic Acid, Venous 2.31 (*)    All other components within normal limits  CULTURE, BLOOD (ROUTINE X 2)  CULTURE, BLOOD (ROUTINE X 2)  URINE CULTURE  URINALYSIS, ROUTINE W REFLEX MICROSCOPIC (NOT AT [facility])  I-STAT CG4 LACTIC ACID, ED    Imaging Review Dg Chest 2 View  09/10/2015  CLINICAL DATA:  Patient returned from 4 days cruise, dehydrated, weakness, and altered mental status. Pt cool to touch, pale, having difficulty speaking. Hx lung cancer, copd, htn, diabetic, smoker. EXAM: CHEST  2 VIEW COMPARISON:  CT, 08/18/2015 and chest radiograph, 07/17/2015. FINDINGS: There is patchy airspace opacity in the left mid. There is a round area of focal opacity in the right lung base. These findings are new from the prior exam. There is persistent volume loss on the right right upper lung zone parenchymal opacity and pleural thickening consistent with treatment related scarring. Cardiac silhouette is normal in size and configuration. No mediastinal or convincing left hilar mass or adenopathy. There is a small lateral pneumothorax, less than 5%, along the mid to  lower aspect of the left hemi thorax. No right pneumothorax. No pleural effusion. Right anterior chest wall power Port-A-Cath is stable with its tip at the caval atrial junction. IMPRESSION: 1. New patchy airspace opacity in the left mid and lower lung consistent with pneumonia. 2. 2.3 cm nodular type opacity at the right lung base. This was not present on the recent prior chest radiograph. There were 2 nodules in this location on the prior CT. This could reflect an enlarged area neoplastic/metastatic disease. It may be inflammatory/infectious in etiology. 3. Small, less than 5%, left lateral pneumothorax. Electronically Signed   By: Lajean Manes M.D.   On: 09/10/2015 20:01   I have personally reviewed and evaluated these images and lab results as part of my medical decision-making.   EKG Interpretation   Date/Time:  Saturday September 10 2015 19:26:01 EDT Ventricular Rate:  106 PR Interval:  116 QRS Duration: 83 QT Interval:  316 QTC Calculation: 420 R Axis:   58 Text Interpretation:  Sinus tachycardia Ventricular premature complex  Consider right atrial enlargement Confirmed by KNOTT MD, Quillian Quince ([id number]) on  09/10/2015 8:08:24 PM       CRITICAL CARE Performed by: Brent General Total critical care time: 50 minutes Critical care time was exclusive of separately billable procedures and treating other patients. Critical care was necessary to treat or prevent imminent or life-threatening deterioration. Critical care was time spent personally by me on the following activities: development of treatment plan with patient and/or surrogate as well as nursing, discussions with consultants, evaluation of patient's response to treatment, examination of patient, obtaining history from patient or surrogate, ordering and performing treatments and interventions, ordering and review of laboratory studies, ordering and review of radiographic studies, pulse oximetry and re-evaluation of patient's  condition.  The patient received 4 L of fluid.  He also received antibiotics for presumed pneumonia base of the chest x-ray.  There is concern this could be PE and infarct based in fact that he has cancer and a recent trip patient will be admitted to critical care.  He is advised plan and all questions were answered    Dalia Heading, PA-C 09/11/15 0132  Leo Grosser, MD 09/11/15 [phone]

## 2015-09-11 NOTE — ED Notes (Signed)
Critical care at bedside  

## 2015-09-11 NOTE — ED Provider Notes (Signed)
Medical screening examination/treatment/procedure(s) were conducted as a shared visit with non-physician practitioner(s) and myself.  I personally evaluated the patient during the encounter.   EKG Interpretation   Date/Time:  Saturday September 10 2015 19:26:01 EDT Ventricular Rate:  106 PR Interval:  116 QRS Duration: 83 QT Interval:  316 QTC Calculation: 420 R Axis:   58 Text Interpretation:  Sinus tachycardia Ventricular premature complex  Consider right atrial enlargement Confirmed by Remington Highbaugh MD, Quillian Quince [phone]) on  09/10/2015 8:08:58 PM      49 year old male with history of lung cancer presents with hypotension and likely pneumonia. He appears clinically dehydrated. No leukocytosis, has mild lactic acidosis which clears with fluid resuscitation. Patient has hypotension that is refractory to aggressive fluid resuscitation. Bedside ultrasound demonstrates signs of fluid responsiveness. Consider pulmonary embolism but no evidence of right heart strain to suggest presence of hemodynamically significant pressure gradient. Patient has minimal oxygen requirement currently and protecting airway appropriately. We'll continue with aggressive resuscitation and consider peripheral dopamine to titrate mean arterial pressure pending critical care evaluation. Patient will require admission to critical care in very tenuous condition and was updated on his critical status, still wishes to be full code after discussion.    CRITICAL CARE Performed by: Leo Grosser Total critical care time: 50 minutes Critical care time was exclusive of separately billable procedures and treating other patients. Critical care was necessary to treat or prevent imminent or life-threatening deterioration. Critical care was time spent personally by me on the following activities: development of treatment plan with patient and/or surrogate as well as nursing, discussions with consultants, evaluation of patient's response to  treatment, examination of patient, obtaining history from patient or surrogate, ordering and performing treatments and interventions, ordering and review of laboratory studies, ordering and review of radiographic studies, pulse oximetry and re-evaluation of patient's condition.  Emergency Focused Ultrasound Exam Limited Ultrasound Assessment for the evaluation of Hypotension ([facility] PROTOCOL)  Performed and interpreted by Dr. Laneta Simmers Indication: Hypotension Multiple images of the bilateral lungs, heart, inferior vena cava, abdomen, and abdominal aorta are obtained for the purposes of estimating presence/absence of pneumothorax, cardiac contractility, volume status, abdominal free fluid with a multifrequency probe. Findings: + B lines, increased density and nml sliding lung, no anechoic fluid in abdomen, nml to increased cardiac contractility, no anechoic fluid surrounding heart, variable IVC collapse Interpretation: no pneumothorax, no hemoperitoneum, no pericardial effusion, no elevated CVP Images archived electronically.  CPT Codes: thorax [id number],  cardiac [id number], abdomen [phone] (study includes all codes)   Emergency Focused Ultrasound Exam Limited Ultrasound of Lower Extremity for DVT  Performed and interpreted by Dr. Laneta Simmers Indication: Hypotension, shortness of breath Transverse views of bilateral lower extremity are obtained in real time for the purposes of evaluation for deep venous thrombosis.  Findings: + collapsible proximal femoral vein bilaterally Interpretation: no proximal deep venous thrombosis Images archived electronically.  CPT Code:   10272  See related encounter note   Leo Grosser, MD 09/11/15 [phone]

## 2015-09-11 NOTE — Progress Notes (Signed)
PULMONARY / CRITICAL CARE MEDICINE   Name: Glen Mendez MRN: [id number] DOB: 07-Mar-1966    ADMISSION DATE:  09/10/2015  REFERRING MD :  ER  CHIEF COMPLAINT:  Altered mental status  INITIAL PRESENTATION:  49 yo male smoker presented with altered mental status, hypotension and sepsis from PNA.  He has hx of Extensive stage SCLC, COPD, DM.  STUDIES:   SIGNIFICANT EVENTS: 11/05 Admit  SUBJECTIVE:  Feels weak.  Not much appetite.  Hard time bringing up sputum.  VITAL SIGNS: Temp:  [97.3 F (36.3 C)-99.2 F (37.3 C)] 99.2 F (37.3 C) (11/06 0800) Pulse Rate:  [72-117] 87 (11/06 1200) Resp:  [12-34] 15 (11/06 1200) BP: (63-118)/(40-79) 116/73 mmHg (11/06 1200) SpO2:  [86 %-100 %] 100 % (11/06 1200) Weight:  [180 lb 12.4 oz (82 kg)-208 lb (94.348 kg)] 180 lb 12.4 oz (82 kg) (11/06 0355) INTAKE / OUTPUT:  Intake/Output Summary (Last 24 hours) at 09/11/15 1309 Last data filed at 09/11/15 1100  Gross per 24 hour  Intake 3004.99 ml  Output    950 ml  Net 2054.99 ml    PHYSICAL EXAMINATION: General: appears tired Neuro:  Alert, normal strength HEENT:  Sores around mouth Cardiovascular:  Regular, no murmur Lungs:  B/l crackles Rt > Lt Abdomen:  Soft, non tender Musculoskeletal:  1+ edema Skin:  Multiple areas of skin ulcers  LABS:  CBC  Recent Labs Lab 09/10/15 1915 09/11/15 0730  WBC 7.7 7.8  HGB 12.6* 9.0*  HCT 38.4* 27.6*  PLT 41* 44*   BMET  Recent Labs Lab 09/10/15 1915 09/11/15 0730  NA 146* 144  K 4.2 3.8  CL 104 109  CO2 29 27  BUN 75* 46*  CREATININE 2.85* 1.62*  GLUCOSE 138* 163*   Electrolytes  Recent Labs Lab 09/10/15 1915 09/11/15 0730  CALCIUM 9.3 7.8*   Sepsis Markers  Recent Labs Lab 09/10/15 2242 09/11/15 0257 09/11/15 0718  LATICACIDVEN 0.74 1.1 1.1   Liver Enzymes  Recent Labs Lab 09/10/15 1915 09/11/15 0730  AST 25 24  ALT 27 25  ALKPHOS 93 84  BILITOT 1.1 0.8  ALBUMIN 2.6* 2.1*   Cardiac  Enzymes  Recent Labs Lab 09/11/15 0257 09/11/15 0718  TROPONINI <0.03 <0.03   Glucose  Recent Labs Lab 09/10/15 1913 09/11/15 0611 09/11/15 0838  GLUCAP 132* 165* 169*    Imaging Dg Chest 2 View  09/10/2015  CLINICAL DATA:  Patient returned from 4 days cruise, dehydrated, weakness, and altered mental status. Pt cool to touch, pale, having difficulty speaking. Hx lung cancer, copd, htn, diabetic, smoker. EXAM: CHEST  2 VIEW COMPARISON:  CT, 08/18/2015 and chest radiograph, 07/17/2015. FINDINGS: There is patchy airspace opacity in the left mid. There is a round area of focal opacity in the right lung base. These findings are new from the prior exam. There is persistent volume loss on the right right upper lung zone parenchymal opacity and pleural thickening consistent with treatment related scarring. Cardiac silhouette is normal in size and configuration. No mediastinal or convincing left hilar mass or adenopathy. There is a small lateral pneumothorax, less than 5%, along the mid to lower aspect of the left hemi thorax. No right pneumothorax. No pleural effusion. Right anterior chest wall power Port-A-Cath is stable with its tip at the caval atrial junction. IMPRESSION: 1. New patchy airspace opacity in the left mid and lower lung consistent with pneumonia. 2. 2.3 cm nodular type opacity at the right lung base. This was not present  on the recent prior chest radiograph. There were 2 nodules in this location on the prior CT. This could reflect an enlarged area neoplastic/metastatic disease. It may be inflammatory/infectious in etiology. 3. Small, less than 5%, left lateral pneumothorax. Electronically Signed   By: Lajean Manes M.D.   On: 09/10/2015 20:01   Dg Chest Port 1 View  09/11/2015  CLINICAL DATA:  Followup pneumothorax. EXAM: PORTABLE CHEST 1 VIEW COMPARISON:  09/10/2015 at 1947 hours FINDINGS: The small lateral left pneumothorax appears smaller on the current study, with only and minimal  sliver of pneumothorax suggested along the lateral left hemi thorax. Tiny apical pneumothorax is also noted. Areas of lung opacity described previously are without substantial change consistent with multifocal pneumonia. IMPRESSION: 1. Small pneumothorax described on the prior study appears smaller with only a minimal sliver of pneumothorax noted along the lateral left hemi thorax on the current exam. There is now a tiny apical pneumothorax evident. 2. No other change. Electronically Signed   By: Lajean Manes M.D.   On: 09/11/2015 01:43     ASSESSMENT / PLAN:  PULMONARY A: Acute hypoxic respiratory failure 2nd to pneumonia. ?small Lt PTX. Hx of COPD, tobacco abuse. P:   Scheduled BDs Flutter valve F/u CXR Oxygen to keep SpO2 90 to 95%  CARDIOVASCULAR Rt port A:  Septic shock 2nd to PNA. Hx of HTN. P:  Pressors to keep MAP > 65 D/c aspirin Hold outpt coreg, lasix  RENAL A:   AKI >> baseline creatinine 1.3 from 08/17/15. Lactic acidosis >> resolved. P:   Monitor renal fx, urine outpt  GASTROINTESTINAL A:   Protein calorie malnutrition. P:   Regular diet D/c pepcid   HEMATOLOGIC A:   Anemia of critical illness and chronic disease. Thrombocytopenia >> baseline 50 from 08/17/15. Extensive stage SCLC >> referred to [facility] to assess for clinical trials after 4 cycles of chemotherapy and salvage palliative XRT to brain. P:  F/u CBC  INFECTIOUS A:  Sepsis from Pneumococcal PNA. Skin ulcers. P:   Day 2 of vancomycin, zosyn, tamiflu >> narrow once cx result final Wound care  Blood 11/06 >> Urine 11/06  >> Pneumococcal Ag 11/06 >> positive Legionella Ag 11/06 >>  Respiratory viral panel 11/06 >>  ENDOCRINE A:   Relative adrenal insufficiency >> on decadron as outpt. DM. P:   SSI Hold outpt glucophage, glucotrol Stress steroids  NEUROLOGIC A:  Acute encephalopathy 2nd to sepsis. Insomnia, bipolar. P:   Monitor mental status Continue elavil, seroquel,  oxycontin Prn klonopin Hold outpt klonopin, trazodone, ambien  Updated pt's wife at bedside.  CC time 37 minutes.  Chesley Mires, MD [facility] Pulmonary/Critical Care 09/11/2015, 1:31 PM Pager:  [phone] After 3pm call: [phone]

## 2015-09-12 ENCOUNTER — Encounter (HOSPITAL_COMMUNITY): Payer: Self-pay | Admitting: *Deleted

## 2015-09-12 ENCOUNTER — Inpatient Hospital Stay (HOSPITAL_COMMUNITY): Payer: Medicaid Other

## 2015-09-12 DIAGNOSIS — R579 Shock, unspecified: Secondary | ICD-10-CM

## 2015-09-12 DIAGNOSIS — J189 Pneumonia, unspecified organism: Secondary | ICD-10-CM

## 2015-09-12 DIAGNOSIS — L8990 Pressure ulcer of unspecified site, unspecified stage: Secondary | ICD-10-CM | POA: Insufficient documentation

## 2015-09-12 DIAGNOSIS — C3490 Malignant neoplasm of unspecified part of unspecified bronchus or lung: Secondary | ICD-10-CM

## 2015-09-12 LAB — BASIC METABOLIC PANEL
ANION GAP: 8 (ref 5–15)
BUN: 26 mg/dL — AB (ref 6–20)
CALCIUM: 7.4 mg/dL — AB (ref 8.9–10.3)
CO2: 27 mmol/L (ref 22–32)
CREATININE: 1.2 mg/dL (ref 0.61–1.24)
Chloride: 103 mmol/L (ref 101–111)
GFR calc Af Amer: >60 mL/min (ref >60)
GLUCOSE: 151 mg/dL — AB (ref 65–99)
Potassium: 3.4 mmol/L — ABNORMAL LOW (ref 3.5–5.1)
Sodium: 138 mmol/L (ref 135–145)

## 2015-09-12 LAB — CBC
HCT: 27.6 % — ABNORMAL LOW (ref 39.0–52.0)
HCT: 25.1 % — ABNORMAL LOW (ref 39.0–52.0)
HEMOGLOBIN: 9 g/dL — AB (ref 13.0–17.0)
Hemoglobin: 8.5 g/dL — ABNORMAL LOW (ref 13.0–17.0)
MCH: 33.6 pg (ref 26.0–34.0)
MCH: 34 pg (ref 26.0–34.0)
MCHC: 32.6 g/dL (ref 30.0–36.0)
MCHC: 33.9 g/dL (ref 30.0–36.0)
MCV: 103 fL — ABNORMAL HIGH (ref 78.0–100.0)
MCV: 100.4 fL — ABNORMAL HIGH (ref 78.0–100.0)
PLATELETS: 41 10*3/uL — AB (ref 150–400)
Platelets: 44 10*3/uL — ABNORMAL LOW (ref 150–400)
RBC: 2.68 MIL/uL — ABNORMAL LOW (ref 4.22–5.81)
RBC: 2.5 MIL/uL — ABNORMAL LOW (ref 4.22–5.81)
RDW: 16.7 % — AB (ref 11.5–15.5)
RDW: 16.2 % — AB (ref 11.5–15.5)
WBC: 7.8 10*3/uL (ref 4.0–10.5)
WBC: 6.3 10*3/uL (ref 4.0–10.5)

## 2015-09-12 LAB — GLUCOSE, CAPILLARY
GLUCOSE-CAPILLARY: 165 mg/dL — AB (ref 65–99)
GLUCOSE-CAPILLARY: 123 mg/dL — AB (ref 65–99)
GLUCOSE-CAPILLARY: 150 mg/dL — AB (ref 65–99)
GLUCOSE-CAPILLARY: 172 mg/dL — AB (ref 65–99)
Glucose-Capillary: 116 mg/dL — ABNORMAL HIGH (ref 65–99)

## 2015-09-12 LAB — URINE CULTURE

## 2015-09-12 LAB — LEGIONELLA PNEUMOPHILA SEROGP 1 UR AG: L. pneumophila Serogp 1 Ur Ag: NEGATIVE

## 2015-09-12 MED ORDER — POTASSIUM CHLORIDE 20 MEQ/15ML (10%) PO SOLN
40.0000 meq | ORAL | Status: AC
  Administered 2015-09-12: 40 meq
  Filled 2015-09-12: qty 30

## 2015-09-12 MED ORDER — LIP MEDEX EX OINT
TOPICAL_OINTMENT | CUTANEOUS | Status: AC
  Filled 2015-09-12: qty 7

## 2015-09-12 MED ORDER — NICOTINE 21 MG/24HR TD PT24
21.0000 mg | MEDICATED_PATCH | Freq: Every day | TRANSDERMAL | Status: DC
  Administered 2015-09-12 – 2015-09-14 (×3): 21 mg via TRANSDERMAL
  Filled 2015-09-12 (×3): qty 1

## 2015-09-12 MED ORDER — SODIUM CHLORIDE 0.9 % IV SOLN
INTRAVENOUS | Status: DC
  Administered 2015-09-12 (×2): via INTRAVENOUS

## 2015-09-12 NOTE — Progress Notes (Signed)
[facility] ADULT ICU REPLACEMENT PROTOCOL FOR AM LAB REPLACEMENT ONLY  The patient does not apply for the [facility] Adult ICU Electrolyte Replacment Protocol based on the criteria listed below:   1. Is GFR >/= 40 ml/min? Yes.    Patient's GFR today is >60 2. Is urine output >/= 0.5 ml/kg/hr for the last 6 hours? No. Patient's UOP is NONE RECORDED since 14:48 11/06  ml/kg/hr 3. Is BUN < 60 mg/dL? Yes.    Patient's BUN today is 26 4. Abnormal electrolyte(s): K+3.4 5. Ordered repletion with: NA 6. If a panic level lab has been reported, has the CCM MD in charge been notified? No..   Physician:  Nicanor Bake [facility] 09/12/2015 5:55 AM

## 2015-09-12 NOTE — Progress Notes (Signed)
PULMONARY / CRITICAL CARE MEDICINE   Name: Glen Mendez MRN: [id number] DOB: 24-Oct-1966    ADMISSION DATE:  09/10/2015  REFERRING MD :  ER  CHIEF COMPLAINT:  Altered mental status  INITIAL PRESENTATION:  49 yo male smoker presented with altered mental status, hypotension and sepsis from PNA.  He has hx of Extensive stage SCLC, COPD, DM.  STUDIES:   SIGNIFICANT EVENTS: 11/05 Admit 11/7 weaning pressors but still not off them. Short of breath w/ exertion but improved since admit. Still c/o left ankle pain. Imaging ordered.   SUBJECTIVE:  Feels weak. Mainly c/o left ankle pain.   VITAL SIGNS: Temp:  [97.5 F (36.4 C)-99 F (37.2 C)] 97.5 F (36.4 C) (11/07 0800) Pulse Rate:  [48-106] 92 (11/07 0830) Resp:  [12-39] 16 (11/07 0830) BP: (75-127)/(45-82) 107/65 mmHg (11/07 0830) SpO2:  [95 %-100 %] 100 % (11/07 0830) Weight:  [83.6 kg (184 lb 4.9 oz)] 83.6 kg (184 lb 4.9 oz) (11/07 0443) INTAKE / OUTPUT:  Intake/Output Summary (Last 24 hours) at 09/12/15 0900 Last data filed at 09/12/15 0830  Gross per 24 hour  Intake 2381.4 ml  Output   1950 ml  Net  431.4 ml    PHYSICAL EXAMINATION: General: no distress. C/o left ankle pain.  Neuro:  Alert, normal strength, oriented x3 but has poor recall HEENT:  Sores around mouth Cardiovascular:  Regular, no murmur Lungs:  B/l crackles Rt > Lt, no accessory muscle use  Abdomen:  Soft, non tender Musculoskeletal:  1+ edema; left ankle w/ limited ROM and pain to touch  Skin:  Multiple areas of skin ulcers  LABS:  CBC  Recent Labs Lab 09/10/15 1915 09/11/15 0730 09/12/15 0436  WBC 7.7 7.8 6.3  HGB 12.6* 9.0* 8.5*  HCT 38.4* 27.6* 25.1*  PLT 41* 44* 41*   BMET  Recent Labs Lab 09/10/15 1915 09/11/15 0730 09/12/15 0436  NA 146* 144 138  K 4.2 3.8 3.4*  CL 104 109 103  CO2 '29 27 27  '$ BUN 75* 46* 26*  CREATININE 2.85* 1.62* 1.20  GLUCOSE 138* 163* 151*   Electrolytes  Recent Labs Lab 09/10/15 1915  09/11/15 0730 09/12/15 0436  CALCIUM 9.3 7.8* 7.4*   Sepsis Markers  Recent Labs Lab 09/11/15 0257 09/11/15 0718 09/11/15 1318  LATICACIDVEN 1.1 1.1 0.9   Liver Enzymes  Recent Labs Lab 09/10/15 1915 09/11/15 0730  AST 25 24  ALT 27 25  ALKPHOS 93 84  BILITOT 1.1 0.8  ALBUMIN 2.6* 2.1*   Cardiac Enzymes  Recent Labs Lab 09/11/15 0257 09/11/15 0718 09/11/15 1318  TROPONINI <0.03 <0.03 <0.03   Glucose  Recent Labs Lab 09/11/15 0838 09/11/15 1228 09/11/15 1721 09/11/15 2022 09/11/15 2216 09/12/15 0731  GLUCAP 169* 133* 197* 157* 157* 123*    Imaging Dg Chest Port 1 View  09/12/2015  CLINICAL DATA:  Community acquired pneumonia. EXAM: PORTABLE CHEST 1 VIEW COMPARISON:  09/11/2015.  CT 08/18/2015. FINDINGS: PowerPort catheter and in stable position. Persistent right upper lobe atelectasis and/or infiltrate. Postradiation change present and this fashion. Persistent bilateral multifocal pulmonary infiltrates and/or mass lesions some of which may be cavitated. These changes could be related to multifocal pneumonia and/or metastatic disease. Tiny right pleural effusion cannot be excluded. Previously identified left apical tiny pneumothorax has resolved. No acute bony abnormality. IMPRESSION: 1. Prior poor catheter stable position. 2. Persistent right upper lobe atelectasis and/or infiltrate. Again postradiation change could present this fashion. 3. Persistent bilateral multifocal pulmonary rounded infiltrates some  of which may be cavitating. These changes could be related to multifocal pneumonia and/or metastatic disease. Tiny right pleural effusion cannot be excluded. 4. Previously identified tiny left apical pneumothorax has resolved. Electronically Signed   By: Marcello Moores  Register   On: 09/12/2015 07:17   No sig change in cxr   ASSESSMENT / PLAN:  PULMONARY A: Acute hypoxic respiratory failure 2nd to pneumonia. ?small Lt PTX. Hx of COPD, tobacco abuse. Now off O2.  Has baseline exertional dyspnea from his cancer.  P:   Scheduled BDs Flutter valve F/u CXR Wednesday  Oxygen to keep SpO2 90 to 95%  CARDIOVASCULAR Rt port A:  Septic shock 2nd to PNA. Hx of HTN. P:  Pressors to keep MAP > 65 D/c aspirin Hold outpt coreg, lasix  RENAL A:   AKI >> baseline creatinine 1.3 from 08/17/15. Lactic acidosis >> resolved. Hypokalemia  P:   Replace lytes as needed Monitor renal fx, urine outpt  GASTROINTESTINAL A:   Protein calorie malnutrition. P:   Regular diet D/c pepcid   HEMATOLOGIC A:   Anemia of critical illness and chronic disease. Thrombocytopenia >> baseline 50 from 08/17/15. Extensive stage SCLC >> referred to [facility] to assess for clinical trials after 4 cycles of chemotherapy and salvage palliative XRT to brain. P:  F/u CBC  INFECTIOUS A:  Sepsis from Pneumococcal PNA. Skin ulcers. P:   Day 3 of vancomycin, zosyn, tamiflu >> narrow once cx result final Wound care  Blood 11/06 >> Urine 11/06: mult species  Pneumococcal Ag 11/06 >> positive Legionella Ag 11/06 >>  Respiratory viral panel 11/06 >> 11/7 UC >>>  ENDOCRINE A:   Relative adrenal insufficiency >> on decadron as outpt. DM. P:   SSI Hold outpt glucophage, glucotrol Stress steroids-->cont   NEUROLOGIC/Muscular skeletal  A:  Acute encephalopathy 2nd to sepsis. -->improving, but still has some short term memory def  Insomnia, bipolar. Left ankle pain and swelling r/o fxn he does not remember falling. Could this represent a pathological fx?? P:   Monitor mental status Continue elavil, seroquel, oxycontin Prn klonopin Hold outpt scheduled klonopin, trazodone, ambien Ck ankle films  Updated pt's wife at bedside.   Making slow progress. Cultures still pending. Source of Pneumococcal most likely pulmonary. Weaning pressors. Will cont current rx. Narrow abx once cultures final. Will re-collect urine. Only other issue is left ankle. Need to r/o fracture.  Eventually we need to address code status. Will try to meet w/ pt and wife about this in next 24-48 hrs.   Erick Colace ACNP-BC [place] Pager # [phone] OR # [phone] if no answer

## 2015-09-12 NOTE — Progress Notes (Signed)
Date: September 12, 2015 Chart reviewed for concurrent status and case management needs. Will continue to follow patient for changes and needs: CXR-multifocal pna/ hypotensive at 75/45/ temp 99.0/ iv levophed drip on going. Velva Harman, RN, BSN, [place]   [phone]

## 2015-09-12 NOTE — Progress Notes (Signed)
eLink Physician-Brief Progress Note Patient Name: Glen Mendez DOB: 1966-02-14 MRN: [id number]   Date of Service  09/12/2015  HPI/Events of Note  Smokes 1 PPD Requesting nicotine patch  eICU Interventions  25mg patch ordered     Intervention Category Minor Interventions: Routine modifications to care plan (e.g. PRN medications for pain, fever)  DSimonne Maffucci11/05/2015, 8:04 PM

## 2015-09-12 NOTE — Clinical Documentation Improvement (Signed)
Pulmonology  Would you please help clarify the medical condition or diagnosis related to physical exam notation of multiple areas of skin ulcers?   Document Specific Site    Document Laterality  Document Ulcer Depth - limited to skin breakdown, with fat layer exposed, with muscle necrosis, with bone necrosis  Etiology of Non-Pressure Ulcers - atherosclerosis, chronic venous hypertension, diabetic ulcer, postphlebotic syndrome, postthrombotic syndrome, varicose ulcer, other, unable to clinically determine, with gangrene   Other condition  Unable to clinically determine   Supporting Information: :  MD physical exam of multiple areas of skin ulcers.   Please exercise your independent, professional judgment when responding. A specific answer is not anticipated or expected.   Thank You, [place] [phone]

## 2015-09-12 NOTE — Consult Note (Signed)
WOC wound consult note Reason for Consult: Numerous areas of skin injury. Wound type: Pressure, thermal (radiation related) and neoplastic Pressure Ulcer POA: Yes Sacrum:  Stage 1 Pressure injury:  2cm x 3cm with no depth (non-blanchable erythema) Bilaterlal Ischial tuberosities: both measure 3.5cm x 2cm x 0.2cm Stage 3 Pressure Injuries with red, moist base and serous exudate, no odor Right elbow: 3cm x 1.5cm x 0.2cm oval area of full thickness skin loss (shear plus pressure, Stage 2) with basement membrane evident and serous exudate. Left elbow: 2cm x 0.5cm x 0.2cm partial thickness tissue loss (shear plus pressure, Stage 2) presenting in a linear fashion. Pink base, serous exudate Left lateral malleolus:  1cm round area of DTPI-no depth Left anterior LE:  Full thickness tissue loss in an area of 12cm x 10cm with sloughing black necrotic tissue revealing a red, moist wound bed over 30% of wound. Serosanguinous exudate in a small amount.  Patient reports this area as painful. Right index finger (distal tip) 1cm round black stable eschar (full thickness). Left lower lip:  1.5cm x 2.5cm x 0.4cm full thickness, pink, most lesion with serous exudate in a small amount. Scattered necrotic areas over bilateral LEs Measurement: As described above. Wound bed:As described above Drainage (amount, consistency, odor) As described above Periwound:intact, dry Dressing procedure/placement/frequency:I have provided a pressure redistribution chair cushion (for OOB use) and bilateral heel boots for correction of alignment and floatation of feet.  Conservative wound care is provided (with goal of maintaining a moist environment conducive to tissue repair despite adverse conditions. Patient reports he does not wish to be turned and repositioned off of back even after he is taught that some skin issues are related to sitting with HOB elevated. A low air loss bed is ordered for transfer to floor (he is on one currently  here in the ICU) for comfort and pressure redistribution at the ITs and sacrum. [place] nursing team will not follow, but will remain available to this patient, the nursing and medical teams.  Please re-consult if needed. Thanks, Maudie Flakes, MSN, RN, [place], Arther Abbott  Pager# [phone]

## 2015-09-13 DIAGNOSIS — E46 Unspecified protein-calorie malnutrition: Secondary | ICD-10-CM

## 2015-09-13 DIAGNOSIS — N289 Disorder of kidney and ureter, unspecified: Secondary | ICD-10-CM

## 2015-09-13 DIAGNOSIS — D701 Agranulocytosis secondary to cancer chemotherapy: Secondary | ICD-10-CM

## 2015-09-13 DIAGNOSIS — R6521 Severe sepsis with septic shock: Secondary | ICD-10-CM

## 2015-09-13 DIAGNOSIS — A419 Sepsis, unspecified organism: Principal | ICD-10-CM

## 2015-09-13 DIAGNOSIS — C7931 Secondary malignant neoplasm of brain: Secondary | ICD-10-CM

## 2015-09-13 DIAGNOSIS — D72829 Elevated white blood cell count, unspecified: Secondary | ICD-10-CM

## 2015-09-13 DIAGNOSIS — D630 Anemia in neoplastic disease: Secondary | ICD-10-CM

## 2015-09-13 DIAGNOSIS — J13 Pneumonia due to Streptococcus pneumoniae: Secondary | ICD-10-CM

## 2015-09-13 DIAGNOSIS — D6959 Other secondary thrombocytopenia: Secondary | ICD-10-CM

## 2015-09-13 DIAGNOSIS — R911 Solitary pulmonary nodule: Secondary | ICD-10-CM

## 2015-09-13 LAB — RESPIRATORY VIRUS PANEL
Adenovirus: NEGATIVE
INFLUENZA A: NEGATIVE
Influenza B: NEGATIVE
Metapneumovirus: NEGATIVE
Parainfluenza 1: NEGATIVE
Parainfluenza 2: NEGATIVE
Parainfluenza 3: NEGATIVE
RESPIRATORY SYNCYTIAL VIRUS A: NEGATIVE
RESPIRATORY SYNCYTIAL VIRUS B: NEGATIVE
Rhinovirus: NEGATIVE

## 2015-09-13 LAB — GLUCOSE, CAPILLARY
GLUCOSE-CAPILLARY: 93 mg/dL (ref 65–99)
Glucose-Capillary: 130 mg/dL — ABNORMAL HIGH (ref 65–99)
Glucose-Capillary: 124 mg/dL — ABNORMAL HIGH (ref 65–99)
Glucose-Capillary: 79 mg/dL (ref 65–99)

## 2015-09-13 LAB — COMPREHENSIVE METABOLIC PANEL
ALT: 27 U/L (ref 17–63)
AST: 22 U/L (ref 15–41)
Albumin: 1.9 g/dL — ABNORMAL LOW (ref 3.5–5.0)
Alkaline Phosphatase: 67 U/L (ref 38–126)
Anion gap: 7 (ref 5–15)
BUN: 18 mg/dL (ref 6–20)
CO2: 28 mmol/L (ref 22–32)
Calcium: 7.1 mg/dL — ABNORMAL LOW (ref 8.9–10.3)
Chloride: 104 mmol/L (ref 101–111)
Creatinine, Ser: 1.03 mg/dL (ref 0.61–1.24)
GFR calc Af Amer: >60 mL/min (ref >60)
GFR calc non Af Amer: >60 mL/min (ref >60)
Glucose, Bld: 143 mg/dL — ABNORMAL HIGH (ref 65–99)
Potassium: 3 mmol/L — ABNORMAL LOW (ref 3.5–5.1)
Sodium: 139 mmol/L (ref 135–145)
Total Bilirubin: 1 mg/dL (ref 0.3–1.2)
Total Protein: 4.9 g/dL — ABNORMAL LOW (ref 6.5–8.1)

## 2015-09-13 LAB — CBC
HEMATOCRIT: 21.9 % — AB (ref 39.0–52.0)
HEMOGLOBIN: 7.4 g/dL — AB (ref 13.0–17.0)
MCH: 33.8 pg (ref 26.0–34.0)
MCHC: 33.8 g/dL (ref 30.0–36.0)
MCV: 100 fL (ref 78.0–100.0)
Platelets: 37 10*3/uL — ABNORMAL LOW (ref 150–400)
RBC: 2.19 MIL/uL — AB (ref 4.22–5.81)
RDW: 16.3 % — ABNORMAL HIGH (ref 11.5–15.5)
WBC: 6 10*3/uL (ref 4.0–10.5)

## 2015-09-13 MED ORDER — POTASSIUM CHLORIDE 20 MEQ/15ML (10%) PO SOLN
40.0000 meq | ORAL | Status: AC
  Administered 2015-09-13 (×2): 40 meq
  Filled 2015-09-13 (×2): qty 30

## 2015-09-13 MED ORDER — SODIUM CHLORIDE 0.9 % IV BOLUS (SEPSIS)
1000.0000 mL | Freq: Once | INTRAVENOUS | Status: AC
  Administered 2015-09-13: 1000 mL via INTRAVENOUS

## 2015-09-13 MED ORDER — NOREPINEPHRINE BITARTRATE 1 MG/ML IV SOLN
2.0000 ug/min | INTRAVENOUS | Status: DC
  Administered 2015-09-13: 2 ug/min via INTRAVENOUS
  Administered 2015-09-14: 4 ug/min via INTRAVENOUS
  Filled 2015-09-13 (×2): qty 4

## 2015-09-13 MED ORDER — BACITRACIN ZINC 500 UNIT/GM EX OINT
TOPICAL_OINTMENT | Freq: Two times a day (BID) | CUTANEOUS | Status: DC
  Administered 2015-09-13: 10:00:00 via TOPICAL
  Administered 2015-09-13: 1 via TOPICAL
  Administered 2015-09-13 – 2015-09-14 (×2): via TOPICAL
  Filled 2015-09-13: qty 28.35

## 2015-09-13 MED ORDER — LEVOFLOXACIN 500 MG PO TABS
500.0000 mg | ORAL_TABLET | Freq: Every day | ORAL | Status: DC
  Administered 2015-09-13 – 2015-09-14 (×2): 500 mg via ORAL
  Filled 2015-09-13 (×2): qty 1

## 2015-09-13 NOTE — Evaluation (Signed)
Physical Therapy Evaluation Patient Details Name: Glen Mendez MRN: [id number] DOB: September 07, 1966 Today's Date: 09/13/2015   History of Present Illness  49 y.o. 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  Pt plans dc home with family assist and could benefit from follow up [place] dependent on acute stay progress.    Follow Up Recommendations Home health PT    Equipment Recommendations  None recommended by PT    Recommendations for Other Services OT consult     Precautions / Restrictions Precautions Precautions: Fall;Other (comment) Precaution Comments: Please use pressure relief pad if up in chair Restrictions Weight Bearing Restrictions: No      Mobility  Bed Mobility Overal bed mobility: Needs Assistance Bed Mobility: Supine to Sit;Sit to Supine     Supine to sit: Min guard Sit to supine: Min guard      Transfers                 General transfer comment: NT - pt did not progress past bedside sitting 2* dizziness with decreased BP to 75/45  Ambulation/Gait                Stairs            Wheelchair Mobility    Modified Rankin (Stroke Patients Only)       Balance Overall balance assessment: Needs assistance Sitting-balance support: Feet supported;Bilateral upper extremity supported Sitting balance-Leahy Scale: Fair                                       Pertinent Vitals/Pain Pain Assessment: Faces Faces Pain Scale: Hurts little more Pain Location: Bil ankles (L > R) Pain Descriptors / Indicators: Aching;Sore Pain Intervention(s): Limited activity within patient's tolerance;Monitored during session    [place] expects to be discharged to::  Private residence Living Arrangements: Spouse/significant other;Children;Parent Available Help at Discharge: Family;Available 24 hours/day Type of Home: House Home Access: Stairs to enter Entrance Stairs-Rails: Right Entrance Stairs-Number of Steps: 3 Home Layout: Able to live on main level with bedroom/bathroom Home Equipment: Walker - 2 wheels;Tub bench      Prior Function Level of Independence: Independent;Independent with assistive device(s)         Comments: `     Hand Dominance   Dominant Hand: Right    Extremity/Trunk Assessment   Upper Extremity Assessment: Generalized weakness           Lower Extremity Assessment: Generalized weakness (Bilat ankle AROM to neutral dorsiflex only)         Communication   Communication: No difficulties  Cognition Arousal/Alertness: Awake/alert Behavior During Therapy: WFL for tasks assessed/performed Overall Cognitive Status: Within Functional Limits for tasks assessed                      General Comments General comments (skin integrity, edema, etc.): Pt with multiple small scabbed over wounds    Exercises        Assessment/Plan    PT Assessment Patient needs continued PT services  PT Diagnosis Difficulty walking   PT Problem List Decreased strength;Decreased activity tolerance;Decreased balance;Decreased mobility;Decreased knowledge of use of DME;Pain;Decreased range of motion  PT Treatment  Interventions DME instruction;Gait training;Stair training;Functional mobility training;Therapeutic activities;Therapeutic exercise;Patient/family education   PT Goals (Current goals can be found in the Care Plan section) Acute Rehab PT Goals Patient Stated Goal: Get back to my own bedroom PT Goal Formulation: With patient Time For Goal Achievement: 09/27/15 Potential to Achieve Goals: Fair    Frequency Min 3X/week   Barriers to discharge        Co-evaluation               End of Session Equipment  Utilized During Treatment: Gait belt Activity Tolerance: Other (comment) (dizziness with decreased BP) Patient left: in bed;with call bell/phone within reach Nurse Communication: Mobility status         Time: 8469-6295 PT Time Calculation (min) (ACUTE ONLY): 27 min   Charges:   PT Evaluation $Initial PT Evaluation Tier I: 1 Procedure PT Treatments $Therapeutic Activity: 8-22 mins   PT G Codes:        Crystal Scarberry 2015/09/26, 3:26 PM

## 2015-09-13 NOTE — Progress Notes (Signed)
[facility] ADULT ICU REPLACEMENT PROTOCOL FOR AM LAB REPLACEMENT ONLY  The patient does not apply for the [facility] Adult ICU Electrolyte Replacment Protocol based on the criteria listed below:   1. Is GFR >/= 40 ml/min? Yes.    Patient's GFR today is >60 2. Is urine output >/= 0.5 ml/kg/hr for the last 6 hours? No. Patient's UOP is NONE RECORDED ml/kg/hr 3. Is BUN < 60 mg/dL? Yes.    Patient's BUN today is 18 4. Abnormal electrolyte(s  K+3.0 5. Ordered repletion with: NA 6. If a panic level lab has been reported, has the CCM MD in charge been notified? No..   Physician:  Nicanor Bake [facility] 09/13/2015 5:21 AM

## 2015-09-13 NOTE — Progress Notes (Signed)
[place] Progress Note Patient Name: Glen Mendez DOB: 15-Jun-1966 MRN: [id number]   Date of Service  09/13/2015  HPI/Events of Note  Hypokalemia  eICU Interventions  Potassium replaced     Intervention Category Intermediate Interventions: Electrolyte abnormality - evaluation and management  Thelma Lorenzetti 09/13/2015, 5:29 AM

## 2015-09-13 NOTE — Progress Notes (Signed)
PULMONARY / CRITICAL CARE MEDICINE   Name: Glen Mendez MRN: [id number] DOB: 08-20-1966    ADMISSION DATE:  09/10/2015  REFERRING MD :  ER  CHIEF COMPLAINT:  Altered mental status  INITIAL PRESENTATION:  49 yo male smoker presented with altered mental status, hypotension and sepsis from PNA.  He has hx of Extensive stage SCLC, COPD, DM.  STUDIES:   SIGNIFICANT EVENTS: 11/05 Admit 11/7 weaning pressors but still not off them. Short of breath w/ exertion but improved since admit. Still c/o left ankle pain. Imaging ordered.  11/8: ankle film w/out evidence of infection. Off pressors. Feels better. Wants to go home.   SUBJECTIVE:  Feels weak; but feeling better.   VITAL SIGNS: Temp:  [97.2 F (36.2 C)-98.5 F (36.9 C)] 98 F (36.7 C) (11/08 0800) Pulse Rate:  [69-110] 89 (11/08 0900) Resp:  [6-35] 25 (11/08 0900) BP: (70-133)/(34-117) 99/56 mmHg (11/08 0900) SpO2:  [93 %-100 %] 98 % (11/08 0914) Weight:  [83 kg (182 lb 15.7 oz)] 83 kg (182 lb 15.7 oz) (11/08 0500) Room air   INTAKE / OUTPUT:  Intake/Output Summary (Last 24 hours) at 09/13/15 1023 Last data filed at 09/13/15 0900  Gross per 24 hour  Intake 1740.01 ml  Output    825 ml  Net 915.01 ml    PHYSICAL EXAMINATION: General: no distress. Feels better. Wants to get OOB Neuro:  Alert, normal strength, oriented x3 but has poor recall HEENT:  Sores around mouth Cardiovascular:  Regular, no murmur Lungs:  B/l crackles Rt > Lt, aeration improved. no accessory muscle use   Abdomen:  Soft, non tender Musculoskeletal:  1+ edema; left ankle w/ limited ROM and pain to touch -->improved  Skin:  Multiple areas of skin ulcers,  LABS:  CBC  Recent Labs Lab 09/11/15 0730 09/12/15 0436 09/13/15 0413  WBC 7.8 6.3 6.0  HGB 9.0* 8.5* 7.4*  HCT 27.6* 25.1* 21.9*  PLT 44* 41* 37*   BMET  Recent Labs Lab 09/11/15 0730 09/12/15 0436 09/13/15 0413  NA 144 138 139  K 3.8 3.4* 3.0*  CL 109 103 104  CO2 '27 27 28   '$ BUN 46* 26* 18  CREATININE 1.62* 1.20 1.03  GLUCOSE 163* 151* 143*   Electrolytes  Recent Labs Lab 09/11/15 0730 09/12/15 0436 09/13/15 0413  CALCIUM 7.8* 7.4* 7.1*   Sepsis Markers  Recent Labs Lab 09/11/15 0257 09/11/15 0718 09/11/15 1318  LATICACIDVEN 1.1 1.1 0.9   Liver Enzymes  Recent Labs Lab 09/10/15 1915 09/11/15 0730 09/13/15 0413  AST '25 24 22  '$ ALT '27 25 27  '$ ALKPHOS 93 84 67  BILITOT 1.1 0.8 1.0  ALBUMIN 2.6* 2.1* 1.9*   Cardiac Enzymes  Recent Labs Lab 09/11/15 0257 09/11/15 0718 09/11/15 1318  TROPONINI <0.03 <0.03 <0.03   Glucose  Recent Labs Lab 09/11/15 2216 09/12/15 0731 09/12/15 1158 09/12/15 1523 09/12/15 2144 09/13/15 0726  GLUCAP 157* 123* 150* 172* 116* 130*    Imaging No results found. No sig change in cxr   ASSESSMENT / PLAN:  PULMONARY A: Acute hypoxic respiratory failure 2nd to pneumonia. ?small Lt PTX. Hx of COPD, tobacco abuse. Now off O2. Has baseline exertional dyspnea from his cancer.  P:   Scheduled BDs Flutter valve F/u CXR Wednesday 11/9 Oxygen to keep SpO2 90 to 95% Mobilize  CARDIOVASCULAR Rt port A:  Septic shock 2nd to PNA-->improved  Hx of HTN. P:  SBP > 100 (goal) D/c aspirin Hold outpt coreg, lasix  RENAL A:   AKI >> baseline creatinine 1.3 from 08/17/15. Lactic acidosis >> resolved. Hypokalemia  P:   Replace lytes as needed Monitor renal fx, urine outpt  GASTROINTESTINAL A:   Protein calorie malnutrition. P:   Regular diet D/c pepcid   HEMATOLOGIC A:   Anemia of critical illness and chronic disease. -->hgb drifting down. No evidence of bleeding.  Thrombocytopenia >> baseline 50 from 08/17/15. Extensive stage SCLC >> referred to [facility] to assess for clinical trials after 4 cycles of chemotherapy and salvage palliative XRT to brain. P:  F/u CBC  INFECTIOUS A:  Sepsis from Pneumococcal PNA. Skin ulcers-->chemo related  P:   Day 4 of vancomycin, zosyn, tamiflu >>will  d/c vanc and tamiflu Wound care  Blood 11/06 >> Urine 11/06: mult species  Pneumococcal Ag 11/06 >> positive Legionella Ag 11/06 >>  Respiratory viral panel 11/06 >>neg 11/7 UC >>>  ENDOCRINE A:   Relative adrenal insufficiency >> on decadron as outpt. DM. P:   SSI Hold outpt glucophage, glucotrol Stress steroids-->cont   NEUROLOGIC/Muscular skeletal  A:  Acute encephalopathy 2nd to sepsis. -->improving, but still has some short term memory def  Insomnia, bipolar. Left ankle pain and swelling ; negative for fracture. -->feels better   P:   Monitor mental status Continue elavil, seroquel, oxycontin Prn klonopin Hold outpt scheduled klonopin, trazodone, ambien   Making slow progress. Cultures still pending. Source of Pneumococcal most likely pulmonary. Off pressors. Will narrow abx and mobilize. Eventually we need to address code status. Will try to meet w/ pt and wife about this in next 24-48 hrs.   Erick Colace ACNP-BC [place] Pager # [phone] OR # [phone] if no answer

## 2015-09-13 NOTE — Progress Notes (Signed)
[place] Progress Note Patient Name: Glen Mendez DOB: 1966/06/28 MRN: [id number]   Date of Service  09/13/2015  HPI/Events of Note  sbp 70  eICU Interventions  Fluid bolus and restart levophed     Intervention Category Major Interventions: Hypotension - evaluation and management  Glen Mendez 09/13/2015, 11:42 PM

## 2015-09-13 NOTE — Progress Notes (Signed)
[facility]   DOB:[birth year]   YI#:[id number]   JJK#:[id number]  Subjective: 49 year old man with a history of extensive small cell lung carcinoma admitted with septic shock. He presented with confusion, and  Shortness of breath. CCM was involved who provided supportive therapy with improvement of his symtoms. He is feeling better. Denies fevers, chills, night sweats, vision changes. He has oral mucositis and skin rash likely due to recent therapy. Denies any chest pain or palpitations. Denies lower extremity swelling. Denies nausea, heartburn or change in bowel habits.  Denies any dysuria. Denies abnormal skin rashes, or neuropathy. Denies any bleeding issues such as epistaxis, hematemesis, hematuria or hematochezia.    DIAGNOSIS: Metastatic small cell lung cancer initially diagnosed as limited stage (T2a, N2, M0) small cell lung cancer in October 2015.  PRIOR THERAPY: 1) status post 4 cycles of systemic chemotherapy with cisplatin and etoposide followed by prophylactic cranial irradiation under the care of Dr. Andreas Ohm at [facility]. 2) status post salvage palliative radiotherapy to several brain lesions with the stereotactic radiotherapy under the care of Dr. Isidore Moos completed on 08/08/2015.   Scheduled Meds: . amitriptyline  25 mg Oral QHS  . antiseptic oral rinse  7 mL Mouth Rinse q12n4p  . bacitracin   Topical BID  . chlorhexidine  15 mL Mouth Rinse BID  . guaiFENesin  1,200 mg Oral BID  . hydrocortisone sod succinate (SOLU-CORTEF) inj  50 mg Intravenous Q6H  . insulin aspart  0-15 Units Subcutaneous TID WC  . insulin aspart  0-5 Units Subcutaneous QHS  . ipratropium-albuterol  3 mL Nebulization Q6H WA  . nicotine  21 mg Transdermal Daily  . oseltamivir  75 mg Oral BID  . oxyCODONE  20 mg Oral Q12H  . piperacillin-tazobactam (ZOSYN)  IV  3.375 g Intravenous Q8H  . potassium chloride  40 mEq Per Tube Q4H  . QUEtiapine  400 mg Oral QHS  . vancomycin  1,000 mg Intravenous BID    Continuous Infusions: . sodium chloride 50 mL/hr at 09/13/15 0600  . norepinephrine (LEVOPHED) Adult infusion 2 mcg/min (09/13/15 0607)   PRN Meds:.albuterol, clonazePAM, fentaNYL (SUBLIMAZE) injection, polyethylene glycol  Objective:  Filed Vitals:   09/13/15 0645  BP: 99/59  Pulse: 91  Temp:   Resp: 26    Body mass index is 24.15 kg/(m^2).  Intake/Output Summary (Last 24 hours) at 09/13/15 0759 Last data filed at 09/13/15 0645  Gross per 24 hour  Intake 2001.76 ml  Output   1525 ml  Net 476.76 ml            Sclerae unicteric  Oropharynx  some mucositis noted   No peripheral adenopathy  Lungs bibasilar rales, no wheezing or rhonchi  Heart regular rate and rhythm right port normal  Abdomen benign  MSK no focal spinal tenderness, 1+ peripheral edema  Neuro pleasantly confused.    CBG (last 3)   Recent Labs  09/12/15 1523 09/12/15 2144 09/13/15 0726  GLUCAP 172* 116* 130*      Labs:   Recent Labs Lab 09/10/15 1915 09/11/15 0730 09/12/15 0436 09/13/15 0413  WBC 7.7 7.8 6.3 6.0  HGB 12.6* 9.0* 8.5* 7.4*  HCT 38.4* 27.6* 25.1* 21.9*  PLT 41* 44* 41* 37*  MCV 103.2* 103.0* 100.4* 100.0  MCH 33.9 33.6 34.0 33.8  MCHC 32.8 32.6 33.9 33.8  RDW 16.7* 16.7* 16.2* 16.3*  LYMPHSABS 0.3*  --   --   --   MONOABS 0.2  --   --   --  EOSABS 0.0  --   --   --   BASOSABS 0.0  --   --   --      Chemistries:    Recent Labs Lab 09/10/15 1915 09/11/15 0730 09/12/15 0436 09/13/15 0413  NA 146* 144 138 139  K 4.2 3.8 3.4* 3.0*  CL 104 109 103 104  CO2 '29 27 27 28  '$ GLUCOSE 138* 163* 151* 143*  BUN 75* 46* 26* 18  CREATININE 2.85* 1.62* 1.20 1.03  CALCIUM 9.3 7.8* 7.4* 7.1*  AST 25 24  --  22  ALT 27 25  --  27  ALKPHOS 93 84  --  67  BILITOT 1.1 0.8  --  1.0     Urine Studies No results for input(s): UHGB, CRYS in the last 72 hours.  Invalid input(s): UACOL, UAPR, USPG, UPH, UTP, UGL, UKET, UBIL, UNIT, UROB, ULEU, UEPI, UWBC, URBC, UBAC, CAST,  UCOM, BILUA  GFR Estimated Creatinine Clearance: 100.2 mL/min (by C-G formula based on Cr of 1.03). Liver Function Tests:  Recent Labs Lab 09/10/15 1915 09/11/15 0730 09/13/15 0413  AST '25 24 22  '$ ALT '27 25 27  '$ ALKPHOS 93 84 67  BILITOT 1.1 0.8 1.0  PROT 6.5 5.5* 4.9*  ALBUMIN 2.6* 2.1* 1.9*   No results for input(s): LIPASE, AMYLASE in the last 168 hours. No results for input(s): AMMONIA in the last 168 hours.  Coagulation profile No results for input(s): INR, PROTIME in the last 168 hours.   Anemia work up No results for input(s): VITAMINB12, FOLATE, FERRITIN, TIBC, IRON, RETICCTPCT in the last 72 hours.  Microbiology Recent Results (from the past 240 hour(s))  Culture, blood (routine x 2)     Status: None (Preliminary result)   Collection Time: 09/10/15  7:15 PM  Result Value Ref Range Status   Specimen Description BLOOD RIGHT CHEST  Final   Special Requests BOTTLES DRAWN AEROBIC AND ANAEROBIC 5ML  Final   Culture   Final    NO GROWTH 1 DAY Performed at [facility]    Report Status PENDING  Incomplete  Culture, blood (routine x 2)     Status: None (Preliminary result)   Collection Time: 09/10/15  8:15 PM  Result Value Ref Range Status   Specimen Description BLOOD BLOOD LEFT HAND  Final   Special Requests IN PEDIATRIC BOTTLE 1ML  Final   Culture   Final    NO GROWTH 1 DAY Performed at [facility]    Report Status PENDING  Incomplete  MRSA PCR Screening     Status: None   Collection Time: 09/11/15  2:08 AM  Result Value Ref Range Status   MRSA by PCR NEGATIVE NEGATIVE Final    Comment:        The GeneXpert MRSA Assay (FDA approved for NASAL specimens only), is one component of a comprehensive MRSA colonization surveillance program. It is not intended to diagnose MRSA infection nor to guide or monitor treatment for MRSA infections.   Respiratory virus panel     Status: None   Collection Time: 09/11/15  3:06 AM  Result Value Ref Range  Status   Respiratory Syncytial Virus A Negative Negative Final   Respiratory Syncytial Virus B Negative Negative Final   Influenza A Negative Negative Final   Influenza B Negative Negative Final   Parainfluenza 1 Negative Negative Final   Parainfluenza 2 Negative Negative Final   Parainfluenza 3 Negative Negative Final   Metapneumovirus Negative Negative Final  Rhinovirus Negative Negative Final   Adenovirus Negative Negative Final    Comment: (NOTE) Performed At: [facility] [place], [place] [id number] Lindon Romp MD TG:[id number]   Urine culture     Status: None   Collection Time: 09/11/15  3:53 AM  Result Value Ref Range Status   Specimen Description URINE, RANDOM  Final   Special Requests NONE  Final   Culture   Final    MULTIPLE SPECIES PRESENT, SUGGEST RECOLLECTION Performed at [facility]    Report Status 09/12/2015 FINAL  Final     Studies:  Dg Chest Port 1 View  09/12/2015  CLINICAL DATA:  Community acquired pneumonia. EXAM: PORTABLE CHEST 1 VIEW COMPARISON:  09/11/2015.  CT 08/18/2015. FINDINGS: PowerPort catheter and in stable position. Persistent right upper lobe atelectasis and/or infiltrate. Postradiation change present and this fashion. Persistent bilateral multifocal pulmonary infiltrates and/or mass lesions some of which may be cavitated. These changes could be related to multifocal pneumonia and/or metastatic disease. Tiny right pleural effusion cannot be excluded. Previously identified left apical tiny pneumothorax has resolved. No acute bony abnormality. IMPRESSION: 1. Prior poor catheter stable position. 2. Persistent right upper lobe atelectasis and/or infiltrate. Again postradiation change could present this fashion. 3. Persistent bilateral multifocal pulmonary rounded infiltrates some of which may be cavitating. These changes could be related to multifocal pneumonia and/or metastatic disease. Tiny right pleural effusion  cannot be excluded. 4. Previously identified tiny left apical pneumothorax has resolved. Electronically Signed   By: Marcello Moores  Register   On: 09/12/2015 07:17   Dg Ankle Left Port  09/12/2015  CLINICAL DATA:  Left ankle pain.  Diabetes. EXAM: PORTABLE LEFT ANKLE - 2 VIEW COMPARISON:  No prior studies available for comparison. FINDINGS: Mild soft tissue swelling. Degenerative change present. No evidence of fracture or dislocation. IMPRESSION: Degenerative changes left ankle. No acute bony or joint abnormality identified . 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 This was initially diagnosed as limited stage disease in October 2015 status post systemic chemotherapy with cisplatin and etoposide concurrent with radiation. He recently underwent stereotactic radiotherapy to metastatic brain lesions. Unfortunately the recent CT scan of the chest showed evidence for disease progression in the right lung apex as well as bilateral pulmonary nodules and suspicious metastatic lesions in the right upper kidney. Unfortunately his platelets count are low today and he is not a good candidate to start systemic chemotherapy at this point. The patient and his family would like to consider the clinical trial at [facility]. Will follow at the cancer center upon discharge.   Sepsis from Pneumococcal Pneumonia As per CCM On day 4 Vancomycin IV    DVT prophylaxis The patient was advised to call immediately if he has any concerning symptoms in the interval. The patient voices understanding of current disease status and treatment options and is in agreement with the current care plan.   Anemia in neoplastic disease No transfusion is indicated at this time Monitor counts closely Transfuse blood to maintain a Hb of 8 g or  if the patient is acutely bleeding  Thrombocytopenia This is due to malignancy, dilution No bleeding issues reported at this time Monitor counts closely No transfusion is indicated at this time Transfuse 1 unit of platelets  if count is less or equal than 10,000 or 20,000 if the patient is acutely bleeding   Leukocytosis This is due to Decadron Prednisone recent Neulasta inflammation infection  No intervention is indicated at this time Will continue to monitor  Leukopenia Due to recent chemotherapy dilution infection antibiotics  Will consider Granix if ANC is less than 1.0 Continue to closely monitor  Malnutrition Consider Nutrition evaluation Appreciate Nutrition follow up  Full Code DNR  Other medical issues as per admitting team     Glen Mendez 09/13/2015  7:59 AM Medical Oncology and Hematology [place]  ADDENDUM: Hematology/Oncology Attending: The patient is seen and examined today. I agree with the above note. This is a very pleasant 48 years old white male with history of extensive stage small cell lung cancer status post systemic chemotherapy with cisplatin and etoposide at [facility] normal oncology followed by prophylactic cranial irradiation. He was recently found to have disease recurrence with metastatic brain lesion as well as metastatic disease systemically. The patient underwent salvage palliative radiotherapy to several brain lesions with stereotactic radiotherapy under the care of Dr. Isidore Moos. The patient was referred to [facility] complaints of [place] for consideration of treatment with immunotherapy on a clinical trial but he has persistent thrombocytopenia and was not eligible for the trial. He was admitted to [facility] with confusion, hypotension, shortness of breath and was found in septic shock. The patient was started on aggressive IV hydration and antibiotics and feeling much better today. He continues to have  significant thrombocytopenia likely to be secondary to his disease. I had a lengthy discussion with the patient today about his condition. The patient may be consider for treatment with immunotherapy on outpatient basis after discharge if he has improvement in his condition, otherwise palliative care and hospice referral are reasonable option for this patient. For thrombocytopenia, we will continue to monitor for now and consider transfusion if his platelets count less than 20,000. Thank you so much for taking good care of Mr. Buchanon, I will continue to follow up the patient with you and assist in his management on as-needed basis.  Disclaimer: This note was dictated with voice recognition software. Similar sounding words can inadvertently be transcribed and may be missed upon review.

## 2015-09-14 ENCOUNTER — Telehealth: Payer: Self-pay | Admitting: Internal Medicine

## 2015-09-14 ENCOUNTER — Inpatient Hospital Stay (HOSPITAL_COMMUNITY): Payer: Medicaid Other

## 2015-09-14 ENCOUNTER — Telehealth: Payer: Self-pay | Admitting: *Deleted

## 2015-09-14 LAB — CBC
HCT: 23.1 % — ABNORMAL LOW (ref 39.0–52.0)
HEMOGLOBIN: 7.8 g/dL — AB (ref 13.0–17.0)
MCH: 33.6 pg (ref 26.0–34.0)
MCHC: 33.8 g/dL (ref 30.0–36.0)
MCV: 99.6 fL (ref 78.0–100.0)
Platelets: 39 10*3/uL — ABNORMAL LOW (ref 150–400)
RBC: 2.32 MIL/uL — ABNORMAL LOW (ref 4.22–5.81)
RDW: 16.4 % — ABNORMAL HIGH (ref 11.5–15.5)
WBC: 7.3 10*3/uL (ref 4.0–10.5)

## 2015-09-14 LAB — COMPREHENSIVE METABOLIC PANEL
ALBUMIN: 1.9 g/dL — AB (ref 3.5–5.0)
ALK PHOS: 72 U/L (ref 38–126)
ALT: 26 U/L (ref 17–63)
ANION GAP: 4 — AB (ref 5–15)
AST: 27 U/L (ref 15–41)
BUN: 13 mg/dL (ref 6–20)
CALCIUM: 6.8 mg/dL — AB (ref 8.9–10.3)
CO2: 28 mmol/L (ref 22–32)
Chloride: 108 mmol/L (ref 101–111)
Creatinine, Ser: 1.08 mg/dL (ref 0.61–1.24)
GFR calc Af Amer: >60 mL/min (ref >60)
GFR calc non Af Amer: >60 mL/min (ref >60)
GLUCOSE: 117 mg/dL — AB (ref 65–99)
POTASSIUM: 3 mmol/L — AB (ref 3.5–5.1)
SODIUM: 140 mmol/L (ref 135–145)
TOTAL PROTEIN: 4.7 g/dL — AB (ref 6.5–8.1)
Total Bilirubin: 1 mg/dL (ref 0.3–1.2)

## 2015-09-14 LAB — GLUCOSE, CAPILLARY
GLUCOSE-CAPILLARY: 166 mg/dL — AB (ref 65–99)
Glucose-Capillary: 137 mg/dL — ABNORMAL HIGH (ref 65–99)

## 2015-09-14 MED ORDER — LORAZEPAM 2 MG/ML IJ SOLN
0.5000 mg | Freq: Once | INTRAMUSCULAR | Status: AC
  Administered 2015-09-14: 0.5 mg via INTRAMUSCULAR

## 2015-09-14 MED ORDER — LORAZEPAM 2 MG/ML IJ SOLN: 0.5000 mg | Freq: Once | INTRAMUSCULAR | Status: DC

## 2015-09-14 MED ORDER — HYDROCORTISONE NA SUCCINATE PF 100 MG IJ SOLR
50.0000 mg | Freq: Two times a day (BID) | INTRAMUSCULAR | Status: DC
  Administered 2015-09-14: 50 mg via INTRAVENOUS
  Filled 2015-09-14: qty 2

## 2015-09-14 MED ORDER — MORPHINE SULFATE (CONCENTRATE) 20 MG/ML PO SOLN: 10.0000 mg | ORAL | Status: DC | PRN

## 2015-09-14 MED ORDER — ZOLPIDEM TARTRATE 10 MG PO TABS: 10.0000 mg | ORAL_TABLET | Freq: Every evening | ORAL | Status: DC | PRN

## 2015-09-14 MED ORDER — IPRATROPIUM-ALBUTEROL 0.5-2.5 (3) MG/3ML IN SOLN: 3.0000 mL | Freq: Four times a day (QID) | RESPIRATORY_TRACT | Status: AC

## 2015-09-14 MED ORDER — HEPARIN SOD (PORK) LOCK FLUSH 100 UNIT/ML IV SOLN
500.0000 [IU] | INTRAVENOUS | Status: AC | PRN
  Administered 2015-09-14: 500 [IU]

## 2015-09-14 MED ORDER — ALPRAZOLAM 1 MG PO TABS
1.0000 mg | ORAL_TABLET | Freq: Once | ORAL | Status: AC
  Administered 2015-09-14: 1 mg via ORAL
  Filled 2015-09-14: qty 1

## 2015-09-14 MED ORDER — POTASSIUM CHLORIDE CRYS ER 20 MEQ PO TBCR
40.0000 meq | EXTENDED_RELEASE_TABLET | Freq: Two times a day (BID) | ORAL | Status: DC
  Administered 2015-09-14: 40 meq via ORAL
  Filled 2015-09-14: qty 2

## 2015-09-14 MED ORDER — ALPRAZOLAM 1 MG PO TABS: 1.0000 mg | ORAL_TABLET | ORAL | Status: DC | PRN

## 2015-09-14 MED ORDER — LORAZEPAM 2 MG/ML IJ SOLN
INTRAMUSCULAR | Status: AC
  Administered 2015-09-14: 0.5 mg via INTRAMUSCULAR
  Filled 2015-09-14: qty 1

## 2015-09-14 MED ORDER — LEVOFLOXACIN 500 MG PO TABS: 500.0000 mg | ORAL_TABLET | Freq: Every day | ORAL | Status: DC

## 2015-09-14 MED ORDER — BACITRACIN ZINC 500 UNIT/GM EX OINT: TOPICAL_OINTMENT | Freq: Two times a day (BID) | CUTANEOUS | Status: AC

## 2015-09-14 NOTE — Consult Note (Addendum)
I met with Glen Mendez and his wife. I discussed prognosis directly, I explained hospice services and simply asked him how he wanted to spend whatever time he had left-he wants to be at home-he does not want to spend his time getting more chemo, traveling to [facility] or hoping falsely for a cure. He knows his death is immenent-his agitation is escalating in teh room and he has a deep desire to leave the hospital-goals are purely comfort- we need to try to get a hospice nurse out today- and need to use whatever hospice provider can make that happen. He required IV ativan following our conversation due to agitation. Ideally these conversations need to occur prior to day of discharge to allow for a smoother transition and period of reflection about goals of care.  Lane Hacker, DO Palliative Medicine [phone]

## 2015-09-14 NOTE — Progress Notes (Signed)
PULMONARY / CRITICAL CARE MEDICINE   Name: Glen Mendez MRN: [id number] DOB: [birth year]    ADMISSION DATE:  09/10/2015  REFERRING MD :  ER  CHIEF COMPLAINT:  Altered mental status  INITIAL PRESENTATION:  49 yo male smoker presented with altered mental status, hypotension and sepsis from PNA.  He has hx of Extensive stage SCLC, COPD, DM.  STUDIES:   SIGNIFICANT EVENTS: 11/05 Admit 11/7 weaning pressors but still not off them. Short of breath w/ exertion but improved since admit. Still c/o left ankle pain. Imaging ordered.  11/8: ankle film w/out evidence of infection. Off pressors. Feels better. Wants to go home.   SUBJECTIVE:  Back on low dose pressors overnight.  Adamant about going home.  See discussion below.   VITAL SIGNS: Temp:  [97.7 F (36.5 C)-98.5 F (36.9 C)] 98.5 F (36.9 C) (11/09 0800) Pulse Rate:  [86-127] 94 (11/09 0851) Resp:  [13-35] 25 (11/09 0851) BP: (71-133)/(42-71) 133/71 mmHg (11/09 0851) SpO2:  [87 %-100 %] 99 % (11/09 0700) Weight:  [187 lb 13.3 oz (85.2 kg)] 187 lb 13.3 oz (85.2 kg) (11/09 0500) 3L [place]   INTAKE / OUTPUT:  Intake/Output Summary (Last 24 hours) at 09/14/15 1023 Last data filed at 09/14/15 0620  Gross per 24 hour  Intake 2219.17 ml  Output   1550 ml  Net 669.17 ml    PHYSICAL EXAMINATION: General: no distress. Feels better. Wants to get OOB Neuro:  Alert, normal strength, oriented x3, appropriate, good insight  HEENT:  Sores around mouth Cardiovascular:  Regular, no murmur Lungs:  B/l crackles Rt > Lt, aeration improved. no accessory muscle use   Abdomen:  Soft, non tender Musculoskeletal:  1+ edema Skin:  Multiple areas of skin ulcers,  LABS:  CBC  Recent Labs Lab 09/12/15 0436 09/13/15 0413 09/14/15 0414  WBC 6.3 6.0 7.3  HGB 8.5* 7.4* 7.8*  HCT 25.1* 21.9* 23.1*  PLT 41* 37* 39*   BMET  Recent Labs Lab 09/12/15 0436 09/13/15 0413 09/14/15 0414  NA 138 139 140  K 3.4* 3.0* 3.0*  CL 103 104 108   CO2 '27 28 28  '$ BUN 26* 18 13  CREATININE 1.20 1.03 1.08  GLUCOSE 151* 143* 117*   Electrolytes  Recent Labs Lab 09/12/15 0436 09/13/15 0413 09/14/15 0414  CALCIUM 7.4* 7.1* 6.8*   Sepsis Markers  Recent Labs Lab 09/11/15 0257 09/11/15 0718 09/11/15 1318  LATICACIDVEN 1.1 1.1 0.9   Liver Enzymes  Recent Labs Lab 09/11/15 0730 09/13/15 0413 09/14/15 0414  AST '24 22 27  '$ ALT '25 27 26  '$ ALKPHOS 84 67 72  BILITOT 0.8 1.0 1.0  ALBUMIN 2.1* 1.9* 1.9*   Cardiac Enzymes  Recent Labs Lab 09/11/15 0257 09/11/15 0718 09/11/15 1318  TROPONINI <0.03 <0.03 <0.03   Glucose  Recent Labs Lab 09/12/15 2144 09/13/15 0726 09/13/15 1154 09/13/15 1636 09/13/15 2128 09/14/15 0733  GLUCAP 116* 130* 124* 93 79 166*    Imaging Dg Chest Port 1 View  09/14/2015  CLINICAL DATA:  Pneumonia EXAM: PORTABLE CHEST 1 VIEW COMPARISON:  Two days ago FINDINGS: Stable right IJ porta catheter, tip at the right atrium. Stable extensive right apical opacification and with volume loss and cavitation. There is associated local pleural thickening or loculated fluid. Nodular opacities in the left lung with cavitating appearance. The mural thickness of these lesions could have progressed since 09/10/2015 chest x-ray. Stable heart size and mediastinal contours. No pneumothorax or significant pleural effusion. IMPRESSION: 1. Multiple cavitating  left lung nodules with probable progressive wall thickening. These could reflect metastases or septic emboli. 2. Stable right upper lobe opacity and volume loss, presumably posttreatment. Electronically Signed   By: Monte Fantasia M.D.   On: 09/14/2015 06:08    ASSESSMENT / PLAN:  PULMONARY A: Acute hypoxic respiratory failure 2nd to pneumonia. Hx of COPD, tobacco abuse. Multiple cavitating nodules - ?mets v septic emboli  Now off O2. Has baseline exertional dyspnea from his cancer.  P:   Scheduled BDs Flutter valve Supplemental O2  Oxygen to keep  SpO2 90 to 95% Mobilize  CARDIOVASCULAR Rt port A:  Septic shock 2nd to PNA-->improved  Hx of HTN. Hypotension - back on levo 11/9 P:  Wean levophed to off  Hold outpt coreg, lasix Will resume stress steroids (?when d/c'd) to facilitate weaning of levophed and resume home decadron on d/c   RENAL A:   AKI >> baseline creatinine 1.3 from 08/17/15. Lactic acidosis >> resolved. Hypokalemia  P:   Replace lytes as needed Monitor renal fx, urine outpt  GASTROINTESTINAL A:   Protein calorie malnutrition. P:   Regular diet D/c pepcid   HEMATOLOGIC A:   Anemia of critical illness and chronic disease. -->hgb drifting down. No evidence of bleeding.  Thrombocytopenia >> baseline 50 from 08/17/15. Extensive stage SCLC >> referred to [facility] to assess for clinical trials after 4 cycles of chemotherapy and salvage palliative XRT to brain. P:  F/u CBC  INFECTIOUS A:  Sepsis from Pneumococcal PNA. Skin ulcers-->chemo related  P:   Wound care  Blood 11/06 >> Urine 11/06: mult species  Pneumococcal Ag 11/06 >> positive Legionella Ag 11/06 >>  Respiratory viral panel 11/06 >>neg 11/7 UC >>>mult colonies>>>  Levaquin 11/8>>>  ENDOCRINE A:   Relative adrenal insufficiency >> on decadron as outpt. DM. P:   SSI Hold outpt glucophage, glucotrol   NEUROLOGIC/Muscular skeletal  A:  Acute encephalopathy 2nd to sepsis. -->improving, but still has some short term memory def  Insomnia, bipolar. Left ankle pain and swelling ; negative for fracture. -->feels better   P:   Monitor mental status Continue elavil, seroquel, oxycontin Prn klonopin Hold outpt scheduled klonopin, trazodone, ambien   Pt adamant this am about going home.  He would like hospice support but feels like he is "done" with all of these treatment which are not helping him in the long run.  He knows that his condition is terminal and, despite our best efforts at previous treatments including 4 cycles chemo and  radiation at [facility], he continues to worsen and is having more frequent complications.  He wants to be at home with his wife to spend what time he has left with her.  We discussed at length the fact that he is currently still requiring pressors and is tachycardic likely indicating that his infection is not well controlled.  He is aware of this and wishes to go home regardless.  He would like to continue PO antibiotics but otherwise wants to focus on comfort and WOL for the short time he has left. Will consult case management and arrange d/c home with hospice hopefully today.     Nickolas Madrid, NP 09/14/2015  10:23 AM Pager: [phone] or [phone]

## 2015-09-14 NOTE — Progress Notes (Addendum)
[id number] does not want to go home with hospice care/does wish to go home with hhc/have notified by RN to alert the attending for new orders./will need o2 parameters if o2 at home is needed/would like to use [facility] for services.

## 2015-09-14 NOTE — Telephone Encounter (Signed)
Betsy from Hospice called pt will be discharged to home hospice today. MD will be attending. POF to cancel all future appts

## 2015-09-14 NOTE — Progress Notes (Signed)
Date: September 14, 2015 Chart reviewed for concurrent status and case management needs. Will continue to follow patient for changes and needs:  [facility] notified of needs. Velva Harman, RN, BSN, [place]   [phone]

## 2015-09-14 NOTE — Progress Notes (Addendum)
When rounding with patient, he asked " when can I go home?". He stated "I have cancer and it has spread to my brain. Dr. Inda Merlin is my cancer doctor and he told me [facility] has some trials, but I am not a candidate for them and there is nothing that they can do to fix me". He then stated "I want to go home. I do not want to be in the hospital. If you cannot fix me, then I want to leave the hospital and go home".  We discussed that he was on medicine (levophed)  through this port-a-cath that keeps his blood pressure from going too low and he said that he wants to turn it off and "just let me go home".    He shared that he "has a wheelchair and bedside commode at home and his bed is on the first level of his home so has what he needs to go home".   Patient is alert and oriented to person, place, time and situation.   Patient's wishes shared with PCCM and patient's assigned RN, Langley Gauss.

## 2015-09-14 NOTE — Progress Notes (Signed)
Notified by Julianne Handler of family request for [facility]osp[facility] and [place] services at home after discharge.   Spoke with patient and wife, [name] at bedside to initiate education related to hospice philosophy, services and team approach to care. Patient stated his ultimate goal was to go home, but he still wants to fight.  Wife stated they placed a phone call to [facility] yesterday evening, to see if anymore options are available.  Explained in detail the services [facility] provides and offered contact information.  Also, offered emotional support to patient and wife. Explained that we would be available at anytime if he is ready for hospice services. Lorenza Cambridge, CMRN and [facility] referral center.   Please call with any questions.  Freddi Starr RN, [facility] Liaison  [phone]

## 2015-09-14 NOTE — Progress Notes (Signed)
   [facility] Physician Progress Note and Electrolyte Replacement  Patient Name: Glen Mendez DOB: June 08, 1966 MRN: [id number]  Date of Service  09/14/2015   HPI/Events of Note    Recent Labs Lab 09/10/15 1915 09/11/15 0730 09/12/15 0436 09/13/15 0413 09/14/15 0414  NA 146* 144 138 139 140  K 4.2 3.8 3.4* 3.0* 3.0*  CL 104 109 103 104 108  CO2 '29 27 27 28 28  '$ GLUCOSE 138* 163* 151* 143* 117*  BUN 75* 46* 26* 18 13  CREATININE 2.85* 1.62* 1.20 1.03 1.08  CALCIUM 9.3 7.8* 7.4* 7.1* 6.8*    Estimated Creatinine Clearance: 95.6 mL/min (by C-G formula based on Cr of 1.08).  Intake/Output      11/08 0701 - 11/09 0700   P.O. 240   I.V. (mL/kg) 1157.2 (13.6)   IV Piggyback 1200   Total Intake(mL/kg) 2597.2 (30.5)   Urine (mL/kg/hr) 1200 (0.6)   Stool 0 (0)   Total Output 1200   Net +1397.2       Stool Occurrence 1 x    - I/O DETAILED x 24h    Total I/O In: 1557.2 [I.V.:557.2; IV Piggyback:1000] Out: 250 [Urine:250] - I/O THIS SHIFT    ASSESSMENT Mild hypokalemia  eICURN Interventions  68mq po kcl   ASSESSMENT: MAJOR ELECTROLYTE      Dr. MBrand Males M.D., [facility].P Pulmonary and Critical Care Medicine Staff Physician [facility]ulmonary and Critical Care Pager: [phone] If no answer or between  15:00h - 7:00h: call [phone]  09/14/2015 6:24 AM

## 2015-09-14 NOTE — Discharge Summary (Signed)
Physician Discharge Summary  Patient ID: Glen Mendez MRN: [id number] DOB/AGE: 12-09-1965 49 y.o.  Admit date: 09/10/2015 Discharge date: 09/14/2015    Discharge Diagnoses:  Active Problems:   Shock ([place])   Pressure ulcer                                                                    D/c plan by Discharge Diagnosis   Stage 4 small cell lung ca  Acute respiratory failure  Pneumococcal PNA  Hx COPD, tobacco abuse  Multiple cavitating nodules - ?mets v septic emboli  PLAN -  Comfort focused care  Complete 10 day course abx - levaquin PO  PRN roxanol - rx given  No further CXR  Home health assistance   Septic shock  Hx HTN  PLAN -  Hold previous coreg, lasix  Resume home decadron   AKI  Lactic acidosis - resolved  Hypokalemia  PLAN -  No further labs   Anemia of critical illness and chronic disease  Thrombocytopenia - baseline plt ~50 PLAN -  No further labs   DM  Relative adrenal insufficiency  PLAN -  Hold home DM rx  Resume home decadron   Acute encephalopathy - resolved  Insomnia, bipolar  Pain  PLAN -  Cont PRN klonopin  Elavil, seroquel  Cont oxycontin -- listed allergy but pt states he has been taking without difficulty  Will add PRN SL roxanol    Brief Summary: Glen Mendez is a 49 y.o. 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  Admitted to ICU with septic shock from pneumococcal PNA.  He was treated with IV fluids, pressors, IV abx.  He improved initially with these efforts, weaned off pressors and was switched to oral abx and mental status was back to baseline.  However on 11/8 he again required pressor support with worsening CXR.  After extensive discussions with the pt and his wife, the decision was made for transition to comfort care and discharge to  home with hospice.  He will continue oral antibiotics, but care will focus on his comfort and spending his remaining time at home with his wife.   Addendum 1345-- after meeting with hospice liaison, pt and his wife no longer wish to receive hospice services. They still wish to go home today with comfort as the main goal of care and DNR.  Pt knows that his time is limited and he wants to live it at home as comfortably as possible, but wishes to receive home health assistance for now to try and increase his strength and mobility.  They are appreciative of hospice services and know that they would likely benefit from those services in the near future.  They are adamant about comfort care and going home but seem hesitant to accept that "hospice is being called in" and seem to associate that with actively dying, despite our efforts to educate them.  Will proceed with discharge and provide max home health services as able with hospice contact information provided to them.   Consults: Oncology (mohammed)   Lines/tubes: None  Microbiology/Sepsis markers: Blood 11/06 >> Urine 11/06: mult species  Pneumococcal Ag 11/06 >> positive Legionella Ag 11/06 >> neg Respiratory viral panel 11/06 >>neg 11/7 UC >>>mult colonies>>>  ABX:  Vanc 11/6>>>11/8 Zosyn 11/6>>>11/8 tamiflu 11/6>>11/8 levaquin 11/8>>>   Significant Diagnostic Studies:  L ankle 11/8>>> Degenerative changes left ankle. No acute bony or joint abnormality identified . CXR 11/9>>>1. Multiple cavitating left lung nodules with probable progressive wall thickening. These could reflect metastases or septic emboli. 2. Stable right upper lobe opacity and volume loss, presumably posttreatment.     Filed Vitals:   09/14/15 1115 09/14/15 1130 09/14/15 1145 09/14/15 1200  BP: 90/52 105/62  104/56  Pulse: 131 114 110 114  Temp:      TempSrc:      Resp:    27  Height:      Weight:      SpO2: 98% 99% 100% 99%     Discharge  Labs  BMET  Recent Labs Lab 09/10/15 1915 09/11/15 0730 09/12/15 0436 09/13/15 0413 09/14/15 0414  NA 146* 144 138 139 140  K 4.2 3.8 3.4* 3.0* 3.0*  CL 104 109 103 104 108  CO2 '29 27 27 28 28  ' GLUCOSE 138* 163* 151* 143* 117*  BUN 75* 46* 26* 18 13  CREATININE 2.85* 1.62* 1.20 1.03 1.08  CALCIUM 9.3 7.8* 7.4* 7.1* 6.8*     CBC   Recent Labs Lab 09/12/15 0436 09/13/15 0413 09/14/15 0414  HGB 8.5* 7.4* 7.8*  HCT 25.1* 21.9* 23.1*  WBC 6.3 6.0 7.3  PLT 41* 37* 39*   Anti-Coagulation No results for input(s): INR in the last 168 hours.        Follow-up Information    Follow up with [facility] K., MD. Schedule an appointment as soon as possible for a visit in 1 week.   Specialty:  Oncology   Contact information:   [address] [place] [address] [phone]          Medication List    STOP taking these medications        aspirin 325 MG EC tablet     ATROVENT HFA 17 MCG/ACT inhaler  Generic drug:  ipratropium     carvedilol 25 MG tablet  Commonly known as:  COREG     fluconazole 100 MG tablet  Commonly known as:  DIFLUCAN     fluticasone 50 MCG/ACT nasal spray  Commonly known as:  FLONASE     furosemide 20 MG tablet  Commonly known as:  LASIX     glipiZIDE 5 MG tablet  Commonly known as:  GLUCOTROL     metFORMIN 500 MG tablet  Commonly known as:  GLUCOPHAGE     ranitidine 150 MG tablet  Commonly known as:  ZANTAC     traZODone 50 MG tablet  Commonly known as:  DESYREL      TAKE these medications        albuterol (5 MG/ML) 0.5% nebulizer solution  Commonly known as:  PROVENTIL  Take 2.5 mg by nebulization every 4 (four) hours as needed for wheezing or shortness of breath.     amitriptyline 25 MG tablet  Commonly known as:  ELAVIL  Take 25 mg by mouth at bedtime.     bacitracin ointment  Apply topically 2 (two) times daily.     bisacodyl 10 MG suppository  Commonly known as:  DULCOLAX  Place 1 suppository (10  mg total) rectally daily as needed for severe constipation.  clonazePAM 1 MG tablet  Commonly known as:  KLONOPIN  Take 0.5-1 tablets (0.5-1 mg total) by mouth 3 (three) times daily as needed for anxiety.     dexamethasone 4 MG tablet  Commonly known as:  DECADRON  Take 2 tablets TID with food. Taper per instructions.     docusate sodium 100 MG capsule  Commonly known as:  COLACE  Take 2 capsules (200 mg total) by mouth at bedtime.     ipratropium-albuterol 0.5-2.5 (3) MG/3ML Soln  Commonly known as:  DUONEB  Take 3 mLs by nebulization every 6 (six) hours.     levofloxacin 500 MG tablet  Commonly known as:  LEVAQUIN  Take 1 tablet (500 mg total) by mouth daily.     morphine 20 MG/ML concentrated solution  Commonly known as:  ROXANOL  Place 0.5 mLs (10 mg total) under the tongue every 3 (three) hours as needed for severe pain or shortness of breath.     ondansetron 8 MG tablet  Commonly known as:  ZOFRAN  Take 8 mg by mouth daily as needed for nausea.     oxyCODONE 20 mg 12 hr tablet  Commonly known as:  OXYCONTIN  Take 1 tablet (20 mg total) by mouth every 12 (twelve) hours.     polyethylene glycol packet  Commonly known as:  MIRALAX / GLYCOLAX  Take 17 g by mouth 2 (two) times daily as needed for mild constipation.     QUEtiapine 400 MG tablet  Commonly known as:  SEROQUEL  Take 400 mg by mouth at bedtime.     zolpidem 10 MG tablet  Commonly known as:  AMBIEN  Take 1 tablet (10 mg total) by mouth at bedtime as needed for sleep.          Disposition: 01-Home or Self Care  Discharged Condition: Glen Mendez has met maximum benefit of inpatient care and is medically stable and cleared for discharge.  Patient is pending follow up as above.      Time spent on disposition:  Greater than 35 minutes.   SignedDarlina Sicilian, NP 09/14/2015  1:37 PM Pager: [phone] or [phone]

## 2015-09-14 NOTE — Telephone Encounter (Signed)
All appointments cancelled as patient is now hospice care

## 2015-09-15 ENCOUNTER — Other Ambulatory Visit: Payer: Self-pay | Admitting: Radiation Oncology

## 2015-09-15 ENCOUNTER — Telehealth: Payer: Self-pay

## 2015-09-15 NOTE — Telephone Encounter (Signed)
Call received from [facility] - pt cancelled admission appointment for today.  Have rescheduled for Monday.

## 2015-09-16 LAB — CULTURE, BLOOD (ROUTINE X 2)
CULTURE: NO GROWTH
Culture: NO GROWTH

## 2015-09-19 ENCOUNTER — Telehealth: Payer: Self-pay | Admitting: Medical Oncology

## 2015-09-19 ENCOUNTER — Telehealth: Payer: Self-pay | Admitting: *Deleted

## 2015-09-19 DIAGNOSIS — F411 Generalized anxiety disorder: Secondary | ICD-10-CM

## 2015-09-19 DIAGNOSIS — C7931 Secondary malignant neoplasm of brain: Secondary | ICD-10-CM

## 2015-09-19 DIAGNOSIS — C3491 Malignant neoplasm of unspecified part of right bronchus or lung: Secondary | ICD-10-CM

## 2015-09-19 MED ORDER — ZOLPIDEM TARTRATE 10 MG PO TABS: 10.0000 mg | ORAL_TABLET | Freq: Every evening | ORAL | Status: AC | PRN

## 2015-09-19 MED ORDER — OXYCODONE HCL ER 20 MG PO T12A: 20.0000 mg | EXTENDED_RELEASE_TABLET | Freq: Two times a day (BID) | ORAL | Status: DC

## 2015-09-19 MED ORDER — CLONAZEPAM 1 MG PO TABS: 0.5000 mg | ORAL_TABLET | Freq: Three times a day (TID) | ORAL | Status: AC | PRN

## 2015-09-19 NOTE — Telephone Encounter (Signed)
Per Dr Julien Nordmann called in refill for klonipin and [place]. Oxycontin refill faxed to pharmacy but it si too early to fill per Pharmacy.called to hospice rn

## 2015-09-19 NOTE — Telephone Encounter (Signed)
Decadron refilled today by Dr Isidore Moos, Ambien refilled 09/14/15 by NP. I faxed oxycontin to pharmacy and left verbal order on voice mail that ok for oxygen 2 l prn and to call back about trazadone as I am unclear what she is asking.

## 2015-09-19 NOTE — Telephone Encounter (Signed)
"  Pam with [facility] calling about this patient of Dr. Worthy Flank newly admitted to Hospice.  1. Could he have an order for oxygen at 2L PRN?  2.  He needs refills on Dexamethasone 4 mg TID, Ambien 10 mg QHS PRN and Oxycontin 20 mg BID which is a little early but may as well get since they have to go to the Pharmacy any way.  [facility] [address]  3. Will Dr. Julien Nordmann allow Trazadone 50 mg on the med list?  He takes this at night for rest.  It was stopped in the hospital but he stands by this helps him so he's continued to take it at bedtime"

## 2015-09-22 ENCOUNTER — Other Ambulatory Visit: Payer: Self-pay | Admitting: *Deleted

## 2015-09-22 ENCOUNTER — Telehealth: Payer: Self-pay | Admitting: *Deleted

## 2015-09-22 NOTE — Telephone Encounter (Signed)
Received message from [place], [place] @ [facility] requesting refill of Oxycontin 20 mg every 12 hours due to pt running low on meds.  Noted that [facility]contin [facility] [address] on  [facility]19/15.  [facility], and was informed that Oxycontin was [facility] [address]  Per pharmacist - it is too soon for refill, and insurance rejected the claim again today.  The earliest time that pt can have refilled is on  09/27/15.  Spoke with Burnis Kingfisher, RN and gave her above information.  Lavina requested nurse to call pt and informed pt of the situation. Called pt at home and spoke with wife [name].  Tana Conch above information.  [name] stated pt has been taking Oxycontin 20 mg  Three Times  Per  Day.  Pt is not out of meds but needed refills.    [name] stated she could give pt Morphine every 3 hours as needed for pain until Oxycontin script can be filled. Lavina's   Phone    [phone].

## 2015-09-26 ENCOUNTER — Other Ambulatory Visit: Payer: Self-pay | Admitting: *Deleted

## 2015-09-26 MED ORDER — MORPHINE SULFATE (CONCENTRATE) 20 MG/ML PO SOLN: 10.0000 mg | ORAL | Status: DC | PRN

## 2015-09-26 NOTE — Telephone Encounter (Signed)
Hospice RN called requesting refill on MSO4 solution. Reviewed with MD, Rx faxed to [place]

## 2015-09-27 ENCOUNTER — Telehealth: Payer: Self-pay | Admitting: *Deleted

## 2015-09-27 DIAGNOSIS — C3490 Malignant neoplasm of unspecified part of unspecified bronchus or lung: Secondary | ICD-10-CM

## 2015-09-27 NOTE — Telephone Encounter (Signed)
Lavina RN with Hospice called pt has congestion,wet cough, still smoking. Taking mucinex, and wife is asking for antibiotics. He completed course of Levaquin ( Started 11/9) Will review with MD

## 2015-09-27 NOTE — Telephone Encounter (Signed)
No need for antibiotics at this point unless the patient has fever or chills or worsening dyspnea.

## 2015-09-27 NOTE — Telephone Encounter (Signed)
Call from [place] at [facility] pt need refill on MSO4 suspension. Review with MD [facility]

## 2015-09-27 NOTE — Telephone Encounter (Signed)
[facility] patient not legible.  This nurse read script to pharmacist.  They can now proceed with this order.

## 2015-09-28 MED ORDER — GUAIFENESIN 200 MG/10ML PO SOLN: 10.0000 mL | ORAL | Status: AC

## 2015-09-28 NOTE — Telephone Encounter (Addendum)
FYI "I called yesterday as patient's wife asked about Levaquin restart because he still has congestion and have not received return call.  I want the Doctor to know we've started patient on guaifenesin 10 ml Q hrs for cough per Hospice protocol."  Will notify Dr. Julien Nordmann.  With th is call notified [place] of Dr. Worthy Flank orders for no antibiotic unless fever, chills or worsening dyspnea.

## 2015-09-28 NOTE — Telephone Encounter (Signed)
Opened in error

## 2015-10-01 ENCOUNTER — Inpatient Hospital Stay (HOSPITAL_COMMUNITY)
Admission: EM | Admit: 2015-10-01 | Discharge: 2015-10-04 | DRG: 200 | Disposition: A | Discharge status: Home / Self Care | Attending: Family Medicine | Admitting: Family Medicine

## 2015-10-01 ENCOUNTER — Emergency Department (HOSPITAL_COMMUNITY)

## 2015-10-01 ENCOUNTER — Encounter (HOSPITAL_COMMUNITY): Payer: Self-pay | Admitting: Emergency Medicine

## 2015-10-01 DIAGNOSIS — J189 Pneumonia, unspecified organism: Secondary | ICD-10-CM | POA: Diagnosis not present

## 2015-10-01 DIAGNOSIS — Z515 Encounter for palliative care: Secondary | ICD-10-CM | POA: Diagnosis present

## 2015-10-01 DIAGNOSIS — I10 Essential (primary) hypertension: Secondary | ICD-10-CM | POA: Diagnosis present

## 2015-10-01 DIAGNOSIS — Z66 Do not resuscitate: Secondary | ICD-10-CM | POA: Diagnosis present

## 2015-10-01 DIAGNOSIS — J449 Chronic obstructive pulmonary disease, unspecified: Secondary | ICD-10-CM | POA: Diagnosis present

## 2015-10-01 DIAGNOSIS — Z789 Other specified health status: Secondary | ICD-10-CM

## 2015-10-01 DIAGNOSIS — D539 Nutritional anemia, unspecified: Secondary | ICD-10-CM | POA: Diagnosis present

## 2015-10-01 DIAGNOSIS — J9312 Secondary spontaneous pneumothorax: Secondary | ICD-10-CM | POA: Diagnosis not present

## 2015-10-01 DIAGNOSIS — E119 Type 2 diabetes mellitus without complications: Secondary | ICD-10-CM | POA: Diagnosis present

## 2015-10-01 DIAGNOSIS — G893 Neoplasm related pain (acute) (chronic): Secondary | ICD-10-CM | POA: Diagnosis not present

## 2015-10-01 DIAGNOSIS — L89159 Pressure ulcer of sacral region, unspecified stage: Secondary | ICD-10-CM | POA: Diagnosis present

## 2015-10-01 DIAGNOSIS — Z79899 Other long term (current) drug therapy: Secondary | ICD-10-CM

## 2015-10-01 DIAGNOSIS — F319 Bipolar disorder, unspecified: Secondary | ICD-10-CM | POA: Diagnosis present

## 2015-10-01 DIAGNOSIS — Z6821 Body mass index (BMI) 21.0-21.9, adult: Secondary | ICD-10-CM

## 2015-10-01 DIAGNOSIS — J939 Pneumothorax, unspecified: Secondary | ICD-10-CM | POA: Insufficient documentation

## 2015-10-01 DIAGNOSIS — Z9689 Presence of other specified functional implants: Secondary | ICD-10-CM | POA: Insufficient documentation

## 2015-10-01 DIAGNOSIS — C7931 Secondary malignant neoplasm of brain: Secondary | ICD-10-CM | POA: Diagnosis present

## 2015-10-01 DIAGNOSIS — Z923 Personal history of irradiation: Secondary | ICD-10-CM | POA: Diagnosis not present

## 2015-10-01 DIAGNOSIS — R0603 Acute respiratory distress: Secondary | ICD-10-CM | POA: Diagnosis present

## 2015-10-01 DIAGNOSIS — J930 Spontaneous tension pneumothorax: Secondary | ICD-10-CM | POA: Diagnosis present

## 2015-10-01 DIAGNOSIS — Z7189 Other specified counseling: Secondary | ICD-10-CM | POA: Diagnosis not present

## 2015-10-01 DIAGNOSIS — Z8701 Personal history of pneumonia (recurrent): Secondary | ICD-10-CM | POA: Diagnosis not present

## 2015-10-01 DIAGNOSIS — E876 Hypokalemia: Secondary | ICD-10-CM | POA: Diagnosis present

## 2015-10-01 DIAGNOSIS — E46 Unspecified protein-calorie malnutrition: Secondary | ICD-10-CM | POA: Diagnosis present

## 2015-10-01 DIAGNOSIS — C3490 Malignant neoplasm of unspecified part of unspecified bronchus or lung: Secondary | ICD-10-CM | POA: Insufficient documentation

## 2015-10-01 DIAGNOSIS — D696 Thrombocytopenia, unspecified: Secondary | ICD-10-CM | POA: Diagnosis present

## 2015-10-01 DIAGNOSIS — C3491 Malignant neoplasm of unspecified part of right bronchus or lung: Secondary | ICD-10-CM | POA: Diagnosis not present

## 2015-10-01 DIAGNOSIS — R0600 Dyspnea, unspecified: Secondary | ICD-10-CM | POA: Diagnosis present

## 2015-10-01 LAB — CBC WITH DIFFERENTIAL/PLATELET
Basophils Absolute: 0.2 10*3/uL — ABNORMAL HIGH (ref 0.0–0.1)
Basophils Relative: 1 %
EOS PCT: 0 %
Eosinophils Absolute: 0 10*3/uL (ref 0.0–0.7)
HCT: 30.6 % — ABNORMAL LOW (ref 39.0–52.0)
HEMOGLOBIN: 9.9 g/dL — AB (ref 13.0–17.0)
LYMPHS PCT: 7 %
Lymphs Abs: 1.2 10*3/uL (ref 0.7–4.0)
MCH: 34 pg (ref 26.0–34.0)
MCHC: 32.4 g/dL (ref 30.0–36.0)
MCV: 105.2 fL — ABNORMAL HIGH (ref 78.0–100.0)
MONOS PCT: 5 %
Monocytes Absolute: 0.8 10*3/uL (ref 0.1–1.0)
NEUTROS PCT: 87 %
Neutro Abs: 14.6 10*3/uL — ABNORMAL HIGH (ref 1.7–7.7)
PLATELETS: 86 10*3/uL — AB (ref 150–400)
RBC: 2.91 MIL/uL — AB (ref 4.22–5.81)
RDW: 20.4 % — ABNORMAL HIGH (ref 11.5–15.5)
WBC: 16.8 10*3/uL — AB (ref 4.0–10.5)

## 2015-10-01 LAB — BASIC METABOLIC PANEL
Anion gap: 11 (ref 5–15)
BUN: 19 mg/dL (ref 6–20)
CHLORIDE: 92 mmol/L — AB (ref 101–111)
CO2: 32 mmol/L (ref 22–32)
Calcium: 9.3 mg/dL (ref 8.9–10.3)
Creatinine, Ser: 1.19 mg/dL (ref 0.61–1.24)
GFR calc non Af Amer: >60 mL/min (ref >60)
Glucose, Bld: 173 mg/dL — ABNORMAL HIGH (ref 65–99)
Potassium: 3.1 mmol/L — ABNORMAL LOW (ref 3.5–5.1)
Sodium: 135 mmol/L (ref 135–145)

## 2015-10-01 LAB — MRSA PCR SCREENING: MRSA by PCR: NEGATIVE

## 2015-10-01 LAB — GLUCOSE, CAPILLARY: GLUCOSE-CAPILLARY: 247 mg/dL — AB (ref 65–99)

## 2015-10-01 MED ORDER — IPRATROPIUM-ALBUTEROL 0.5-2.5 (3) MG/3ML IN SOLN
3.0000 mL | Freq: Four times a day (QID) | RESPIRATORY_TRACT | Status: DC
  Administered 2015-10-01 – 2015-10-04 (×11): 3 mL via RESPIRATORY_TRACT
  Filled 2015-10-01 (×11): qty 3

## 2015-10-01 MED ORDER — MIDAZOLAM HCL 2 MG/2ML IJ SOLN
INTRAMUSCULAR | Status: AC
  Filled 2015-10-01: qty 2

## 2015-10-01 MED ORDER — ENOXAPARIN SODIUM 40 MG/0.4ML ~~LOC~~ SOLN
40.0000 mg | Freq: Every day | SUBCUTANEOUS | Status: DC
  Administered 2015-10-01 – 2015-10-02 (×2): 40 mg via SUBCUTANEOUS
  Filled 2015-10-01 (×3): qty 0.4

## 2015-10-01 MED ORDER — BISACODYL 10 MG RE SUPP: 10.0000 mg | Freq: Every day | RECTAL | Status: DC | PRN

## 2015-10-01 MED ORDER — MORPHINE SULFATE (PF) 4 MG/ML IV SOLN
4.0000 mg | Freq: Once | INTRAVENOUS | Status: AC
  Administered 2015-10-01: 4 mg via INTRAVENOUS
  Filled 2015-10-01: qty 1

## 2015-10-01 MED ORDER — LIDOCAINE HCL (PF) 1 % IJ SOLN
30.0000 mL | Freq: Once | INTRAMUSCULAR | Status: AC
  Administered 2015-10-01: 30 mL via INTRADERMAL
  Filled 2015-10-01: qty 30

## 2015-10-01 MED ORDER — MORPHINE SULFATE (PF) 4 MG/ML IV SOLN
4.0000 mg | INTRAVENOUS | Status: DC | PRN
  Administered 2015-10-01 – 2015-10-02 (×3): 4 mg via INTRAVENOUS
  Filled 2015-10-01 (×3): qty 1

## 2015-10-01 MED ORDER — PIPERACILLIN-TAZOBACTAM 3.375 G IVPB
3.3750 g | Freq: Three times a day (TID) | INTRAVENOUS | Status: DC
  Administered 2015-10-01 – 2015-10-02 (×3): 3.375 g via INTRAVENOUS
  Filled 2015-10-01 (×5): qty 50

## 2015-10-01 MED ORDER — ONDANSETRON HCL 4 MG/2ML IJ SOLN: 4.0000 mg | Freq: Three times a day (TID) | INTRAMUSCULAR | Status: AC | PRN

## 2015-10-01 MED ORDER — MORPHINE SULFATE (CONCENTRATE) 20 MG/ML PO SOLN: 10.0000 mg | ORAL | Status: DC | PRN

## 2015-10-01 MED ORDER — DEXTROSE-NACL 5-0.9 % IV SOLN
INTRAVENOUS | Status: DC
  Administered 2015-10-01 – 2015-10-04 (×6): via INTRAVENOUS

## 2015-10-01 MED ORDER — INSULIN ASPART 100 UNIT/ML ~~LOC~~ SOLN
0.0000 [IU] | Freq: Three times a day (TID) | SUBCUTANEOUS | Status: DC
  Administered 2015-10-02 (×2): 3 [IU] via SUBCUTANEOUS
  Administered 2015-10-02 – 2015-10-03 (×2): 2 [IU] via SUBCUTANEOUS
  Administered 2015-10-03 – 2015-10-04 (×2): 1 [IU] via SUBCUTANEOUS
  Administered 2015-10-04: 2 [IU] via SUBCUTANEOUS

## 2015-10-01 MED ORDER — ALBUTEROL SULFATE (5 MG/ML) 0.5% IN NEBU: 2.5000 mg | INHALATION_SOLUTION | RESPIRATORY_TRACT | Status: DC | PRN

## 2015-10-01 MED ORDER — LEVOFLOXACIN IN D5W 750 MG/150ML IV SOLN: 750.0000 mg | INTRAVENOUS | Status: DC

## 2015-10-01 MED ORDER — CLONAZEPAM 0.5 MG PO TABS
0.5000 mg | ORAL_TABLET | Freq: Three times a day (TID) | ORAL | Status: DC | PRN
  Administered 2015-10-04: 0.5 mg via ORAL
  Filled 2015-10-01: qty 1

## 2015-10-01 MED ORDER — MORPHINE SULFATE (CONCENTRATE) 10 MG/0.5ML PO SOLN: 10.0000 mg | ORAL | Status: DC | PRN

## 2015-10-01 MED ORDER — ACETAMINOPHEN 325 MG PO TABS: 650.0000 mg | ORAL_TABLET | Freq: Four times a day (QID) | ORAL | Status: DC | PRN

## 2015-10-01 MED ORDER — QUETIAPINE FUMARATE 400 MG PO TABS
400.0000 mg | ORAL_TABLET | Freq: Every day | ORAL | Status: DC
  Administered 2015-10-01 – 2015-10-03 (×3): 400 mg via ORAL
  Filled 2015-10-01 (×4): qty 1

## 2015-10-01 MED ORDER — MORPHINE SULFATE 10 MG/5ML PO SOLN: 10.0000 mg | ORAL | Status: DC | PRN

## 2015-10-01 MED ORDER — AMITRIPTYLINE HCL 25 MG PO TABS
25.0000 mg | ORAL_TABLET | Freq: Every day | ORAL | Status: DC
  Administered 2015-10-01 – 2015-10-03 (×3): 25 mg via ORAL
  Filled 2015-10-01 (×4): qty 1

## 2015-10-01 MED ORDER — VANCOMYCIN HCL IN DEXTROSE 1-5 GM/200ML-% IV SOLN
1000.0000 mg | Freq: Three times a day (TID) | INTRAVENOUS | Status: DC
  Administered 2015-10-01 – 2015-10-02 (×3): 1000 mg via INTRAVENOUS
  Filled 2015-10-01 (×5): qty 200

## 2015-10-01 MED ORDER — POLYETHYLENE GLYCOL 3350 17 G PO PACK: 17.0000 g | PACK | Freq: Two times a day (BID) | ORAL | Status: DC | PRN

## 2015-10-01 MED ORDER — GLYCOPYRROLATE 0.2 MG/ML IJ SOLN
0.2000 mg | Freq: Once | INTRAMUSCULAR | Status: AC
  Administered 2015-10-01: 0.2 mg via INTRAVENOUS
  Filled 2015-10-01: qty 1

## 2015-10-01 MED ORDER — FOLIC ACID 1 MG PO TABS
1.0000 mg | ORAL_TABLET | Freq: Every day | ORAL | Status: DC
  Administered 2015-10-01 – 2015-10-04 (×4): 1 mg via ORAL
  Filled 2015-10-01 (×7): qty 1

## 2015-10-01 MED ORDER — SODIUM CHLORIDE 0.9 % IJ SOLN
INTRAMUSCULAR | Status: AC
  Filled 2015-10-01: qty 10

## 2015-10-01 MED ORDER — ONDANSETRON HCL 4 MG PO TABS: 8.0000 mg | ORAL_TABLET | Freq: Every day | ORAL | Status: DC | PRN

## 2015-10-01 MED ORDER — DEXAMETHASONE 2 MG PO TABS: 2.0000 mg | ORAL_TABLET | Freq: Every day | ORAL | Status: DC

## 2015-10-01 MED ORDER — FENTANYL CITRATE (PF) 100 MCG/2ML IJ SOLN
INTRAMUSCULAR | Status: AC
  Filled 2015-10-01: qty 2

## 2015-10-01 MED ORDER — POTASSIUM CHLORIDE CRYS ER 20 MEQ PO TBCR
40.0000 meq | EXTENDED_RELEASE_TABLET | Freq: Two times a day (BID) | ORAL | Status: AC
  Administered 2015-10-01 – 2015-10-02 (×2): 40 meq via ORAL
  Filled 2015-10-01 (×5): qty 2

## 2015-10-01 MED ORDER — VITAMIN B-1 100 MG PO TABS
100.0000 mg | ORAL_TABLET | Freq: Every day | ORAL | Status: DC
  Administered 2015-10-01 – 2015-10-04 (×4): 100 mg via ORAL
  Filled 2015-10-01 (×7): qty 1

## 2015-10-01 MED ORDER — DEXAMETHASONE 2 MG PO TABS
2.0000 mg | ORAL_TABLET | Freq: Every day | ORAL | Status: DC
  Administered 2015-10-01 – 2015-10-04 (×4): 2 mg via ORAL
  Filled 2015-10-01 (×5): qty 1

## 2015-10-01 MED ORDER — DOCUSATE SODIUM 100 MG PO CAPS
200.0000 mg | ORAL_CAPSULE | Freq: Every day | ORAL | Status: DC
  Administered 2015-10-01 – 2015-10-02 (×2): 200 mg via ORAL
  Filled 2015-10-01 (×3): qty 2

## 2015-10-01 MED ORDER — ALBUTEROL SULFATE (2.5 MG/3ML) 0.083% IN NEBU
2.5000 mg | INHALATION_SOLUTION | RESPIRATORY_TRACT | Status: DC | PRN
  Administered 2015-10-02: 2.5 mg via RESPIRATORY_TRACT
  Filled 2015-10-01: qty 3

## 2015-10-01 MED ORDER — IPRATROPIUM-ALBUTEROL 0.5-2.5 (3) MG/3ML IN SOLN
RESPIRATORY_TRACT | Status: AC
  Filled 2015-10-01: qty 3

## 2015-10-01 MED ORDER — ACETAMINOPHEN 650 MG RE SUPP: 650.0000 mg | Freq: Four times a day (QID) | RECTAL | Status: DC | PRN

## 2015-10-01 MED ORDER — OXYCODONE HCL ER 20 MG PO T12A
20.0000 mg | EXTENDED_RELEASE_TABLET | Freq: Two times a day (BID) | ORAL | Status: DC
  Administered 2015-10-01 – 2015-10-02 (×2): 20 mg via ORAL
  Filled 2015-10-01 (×2): qty 1

## 2015-10-01 MED ORDER — FENTANYL CITRATE (PF) 100 MCG/2ML IJ SOLN
INTRAMUSCULAR | Status: AC | PRN
  Administered 2015-10-01: 100 ug via INTRAVENOUS

## 2015-10-01 MED ORDER — ATROPINE SULFATE 1 % OP SOLN
2.0000 [drp] | Freq: Once | OPHTHALMIC | Status: AC
  Administered 2015-10-01: 2 [drp] via SUBLINGUAL
  Filled 2015-10-01 (×2): qty 2

## 2015-10-01 MED ORDER — IPRATROPIUM-ALBUTEROL 0.5-2.5 (3) MG/3ML IN SOLN
3.0000 mL | Freq: Four times a day (QID) | RESPIRATORY_TRACT | Status: DC
Start: 2015-10-01 — End: 2015-10-01

## 2015-10-01 NOTE — Progress Notes (Signed)
Pt requesting SCDs be removed for a short while.  Devices removed.

## 2015-10-01 NOTE — ED Notes (Signed)
Per [facility] patient has been having breathing difficulty since this morning.  Wife called 911.  [facility] placed on patient on CPAP, heart rate in 160's.  On arrival patient alert and oriented and requesting to have CPAP mask removed.  Heart rate 170.  Patient given yankaeur to remove oral secretions. Patient told Dr Vanita Panda he does not wish to be resuscitated or intubated.
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
## 2015-10-01 NOTE — Progress Notes (Signed)
RT decreased O2 to 3L which is home setting. Pt denies SOB at this time. RT will continue to monitor.

## 2015-10-01 NOTE — Progress Notes (Signed)
Palliative Team saw Glen Mendez on 11/9 only a few minutes before he was discharged from the intensive care unit at [facility] with hospice services- hospice was a condition of the discharge otherwise would have been AMA. He has very high levels of anxiety and progressive lung cancer. We need to determine if he is an active hospice patient.Very advanced disease and difficulty coping with the diagnosis.  Our team will see him at the earliest possible time we have a provider available. Comfort care would be appropriate and from ED documentation it appears he is seeking relief from his dyspnea. First contact needs to be his hospice provider if he is enrolled.  Lane Hacker, DO Palliative Medicine [phone]

## 2015-10-01 NOTE — Progress Notes (Signed)
In total, bilateral buttocks has 9 open areas, mostly round in appearance, varying in size.  One largest one with yellow base.  Areas suggestive of shingles.  Pt states he has had shingles before and that all of these "popped up" somewhere around a week ago.  Sacral foam dressing applied.  Wound nurse consult already in chart.

## 2015-10-01 NOTE — Progress Notes (Signed)
Spoke with Dr. Mingo Amber to ask if the chest tube is to be to water seal or suction.  MD will investigate and call me back.

## 2015-10-01 NOTE — Progress Notes (Signed)
Text page sent to Dr. Mingo Amber asking if he decided what he wanted done with the Chest Tube, water seal or suction.  Awaiting response.

## 2015-10-01 NOTE — Progress Notes (Signed)
Pt prefers chocolate ensure; normally drinks two daily when at home.  Meds given with chocolate ensure.

## 2015-10-01 NOTE — Sedation Documentation (Signed)
Sedation started.

## 2015-10-01 NOTE — Progress Notes (Signed)
ANTICOAGULATION CONSULT NOTE - Initial Consult  Pharmacy Consult for lovenox Indication: VTE prophylaxis  Allergies  Allergen Reactions  . Prilosec [Omeprazole] Hives and Itching  . Vicodin [Hydrocodone-Acetaminophen]     " makes me itch" i can take it if I take bendryl    Patient Measurements: Height: '6\' 6"'$  (198.1 cm) Weight: 187 lb 8 oz (85.049 kg) IBW/kg (Calculated) : 91.4 Heparin Dosing Weight:   Vital Signs: Temp: 97.7 F (36.5 C) (11/26 1539) Temp Source: Oral (11/26 1539) BP: 104/77 mmHg (11/26 1539) Pulse Rate: 120 (11/26 1539)  Labs:  Recent Labs  10/01/15 0836  HGB 9.9*  HCT 30.6*  PLT 86*  CREATININE 1.19    Estimated Creatinine Clearance: 92.3 mL/min (by C-G formula based on Cr of 1.19).   Medical History: Past Medical History  Diagnosis Date  . Lung cancer ([place])   . Bipolar disorder ([place])   . COPD (chronic obstructive pulmonary disease) ([place])   . Hypertension   . Diabetes mellitus without complication ([place])   . Brain metastases ([place])   . Radiation     [facility], 10 days to brain, and 6 weeks to chest by Dr. Roxanne Mins    Medications:  Scheduled:  . amitriptyline  25 mg Oral QHS  . dexamethasone  2 mg Oral Daily  . docusate sodium  200 mg Oral QHS  . fentaNYL      . folic acid  1 mg Oral Daily  . ipratropium-albuterol  3 mL Nebulization Q6H  . oxyCODONE  20 mg Oral Q12H  . QUEtiapine  400 mg Oral QHS  . sodium chloride      . thiamine  100 mg Oral Daily   Infusions:  . dextrose 5 % and 0.9% NaCl 100 mL/hr at 10/01/15 1406    Assessment: 49 yo male will be put on lovenox for VTE prophylaxis.  Plt is currently 89 K; MD is aware.   Goal of Therapy:  Anti-Xa level 0.3-0.6 Monitor platelets by anticoagulation protocol: Yes   Plan:  - lovenox 40 mg sq q24h - monitor CBC closely  Glen Mendez, Tsz-Yin 10/01/2015,6:08 PM

## 2015-10-01 NOTE — ED Provider Notes (Signed)
CSN: [id number]     Arrival date & time 10/01/15  0732 History   First MD Initiated Contact with Patient 10/01/15 0735     Chief Complaint  Patient presents with  . Respiratory Distress     (Consider location/radiation/quality/duration/timing/severity/associated sxs/prior Treatment) HPI Patient presents in respiratory distress.  EMS. Patient is capable of speaking, though in brief sentences. He states that he has felt progressively worse over the past day. He acknowledges a history of stage IV lung cancer, currently with hospice enrollment. He has baseline dyspnea, but over the past 24 hours has developed increased fatigue, cough, as well as worsening dyspnea. No report of new fever. No relief with home albuterol. EMS reports the patient received 20 mg of albuterol, as well as noninvasive pressure support ventilation in route, with slight improvement in his condition.  Past Medical History  Diagnosis Date  . Lung cancer ([place])   . Bipolar disorder ([place])   . COPD (chronic obstructive pulmonary disease) ([place])   . Hypertension   . Diabetes mellitus without complication ([place])   . Brain metastases ([place])   . Radiation     [facility], 10 days to brain, and 6 weeks to chest by Dr. Roxanne Mins   History reviewed. No pertinent past surgical history. Family History  Problem Relation Age of Onset  . Adopted: Yes  . Family history unknown: Yes   Social History  Substance Use Topics  . Smoking status: Current Every Day Smoker -- 0.50 packs/day for 30 years    Types: Cigarettes  . Smokeless tobacco: Never Used  . 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 Authorizing Provider  albuterol (PROVENTIL) (5 MG/ML) 0.5% nebulizer solution Take 2.5  mg by nebulization every 4 (four) hours as needed for wheezing or shortness of breath.    Historical Provider, MD  amitriptyline (ELAVIL) 25 MG tablet Take 25 mg by mouth at bedtime.    Historical Provider, MD  bacitracin ointment Apply topically 2 (two) times daily. 09/14/15   Marijean Heath, NP  bisacodyl (DULCOLAX) 10 MG suppository Place 1 suppository (10 mg total) rectally daily as needed for severe constipation. 07/18/15   Debbe Odea, MD  clonazePAM (KLONOPIN) 1 MG tablet Take 0.5-1 tablets (0.5-1 mg total) by mouth 3 (three) times daily as needed for anxiety. 09/19/15   Curt Bears, MD  dexamethasone (DECADRON) 4 MG tablet TAKE 2 TABLETS BY MOUTH 3 TIMES A DAY WITH FOOD(TAPER PER INSTRUCTIONS) 09/19/15   Eppie Gibson, MD  docusate sodium (COLACE) 100 MG capsule Take 2 capsules (200 mg total) by mouth at bedtime. 07/18/15   Debbe Odea, MD  GuaiFENesin 200 MG/10ML SOLN Take 10 mLs (200 mg total) by mouth every 4 (four) hours. For cough 09/28/15   Everrett Coombe, MD  ipratropium-albuterol (DUONEB) 0.5-2.5 (3) MG/3ML SOLN Take 3 mLs by nebulization every 6 (six) hours. 09/14/15   Marijean Heath, NP  levofloxacin (LEVAQUIN) 500 MG tablet Take 1 tablet (500 mg total) by mouth daily. 09/14/15   Marijean Heath, NP  morphine (ROXANOL) 20 MG/ML concentrated solution Place 0.5 mLs (10 mg total) under the tongue every 3 (three) hours as needed for severe pain or shortness of breath. 09/26/15  Curt Bears, MD  ondansetron (ZOFRAN) 8 MG tablet Take 8 mg by mouth daily as needed for nausea.     Historical Provider, MD  oxyCODONE (OXYCONTIN) 20 mg 12 hr tablet Take 1 tablet (20 mg total) by mouth every 12 (twelve) hours. 09/19/15   Curt Bears, MD  polyethylene glycol [facility] / GLYCOLAX) packet Take 17 g by mouth 2 (two) times daily as needed for mild constipation. 07/18/15   Debbe Odea, MD  QUEtiapine (SEROQUEL) 400 MG tablet Take 400 mg by mouth at bedtime.    Historical  Provider, MD  zolpidem (AMBIEN) 10 MG tablet Take 1 tablet (10 mg total) by mouth at bedtime as needed for sleep. 09/19/15   Curt Bears, MD   BP 104/84 mmHg  Pulse 151  Resp 42  SpO2 98% Physical Exam  Constitutional: He is oriented to person, place, and time. He appears well-developed. He has a sickly appearance. He appears distressed.  Very unwell appearing male, very uncomfortable, diaphoretic, with fecal matter under his fingernails  HENT:  Head: Normocephalic and atraumatic.  Eyes: Conjunctivae and EOM are normal.  Cardiovascular: Normal rate and regular rhythm.   Pulmonary/Chest: No stridor. He is in respiratory distress. He has wheezes.  Decreased breath sounds throughout, respiratory distress, tachypnea, accessory muscle use.  Abdominal: He exhibits no distension.  Musculoskeletal: He exhibits no edema.  Neurological: He is alert and oriented to person, place, and time.  Skin: Skin is warm and dry.  Psychiatric: His mood appears anxious.  Nursing note and vitals reviewed.   ED Course  CHEST TUBE INSERTION Date/Time: 10/01/2015 10:00 AM Performed by: Carmin Muskrat Authorized by: Carmin Muskrat Consent: The procedure was performed in an emergent situation. Verbal consent obtained. Written consent obtained. Risks and benefits: risks, benefits and alternatives were discussed Consent given by: patient Patient understanding: patient states understanding of the procedure being performed Patient consent: the patient's understanding of the procedure matches consent given Procedure consent: procedure consent matches procedure scheduled Relevant documents: relevant documents present and verified Test results: test results available and properly labeled Site marked: the operative site was marked Imaging studies: imaging studies available Required items: required blood products, implants, devices, and special equipment available Patient identity confirmed: verbally with  patient Time out: Immediately prior to procedure a "time out" was called to verify the correct patient, procedure, equipment, support staff and site/side marked as required. Indications: tension pneumothorax Patient sedated: no Anesthesia: local infiltration and see MAR for details Local anesthetic: lidocaine 1% without epinephrine Preparation: skin prepped with ChloraPrep Placement location: left lateral Scalpel size: 10 Tube size: 28 [place] Dissection instrument: Kelly clamp Ultrasound guidance: no Tension pneumothorax heard: yes Tube connected to: suction Drainage characteristics: air. Suture material: 0 silk Dressing: see RN notes. Post-insertion x-ray findings: tube in good position Patient tolerance: Patient tolerated the procedure well with no immediate complications Comments: Following placement of the chest tube, with audible rush of air, the patient felt better, though he remained tachycardic, there was notably diminished work of breathing.    (including critical care time) Labs Review Labs Reviewed  BASIC METABOLIC PANEL - Abnormal; Notable for the following:    Potassium 3.1 (*)    Chloride 92 (*)    Glucose, Bld 173 (*)    All other components within normal limits  CBC WITH DIFFERENTIAL/PLATELET - Abnormal; Notable for the following:    WBC 16.8 (*)    RBC 2.91 (*)    Hemoglobin 9.9 (*)    HCT 30.6 (*)  MCV 105.2 (*)    RDW 20.4 (*)    Platelets 86 (*)    Neutro Abs 14.6 (*)    Basophils Absolute 0.2 (*)    All other components within normal limits    Imaging Review Dg Chest Port 1 View  10/01/2015  CLINICAL DATA:  Respiratory distress.  History of lung cancer. EXAM: PORTABLE CHEST 1 VIEW COMPARISON:  09/14/2015 FINDINGS: Right IJ Port-A-Cath with tip over the right atrium unchanged. Chronic changes over the right upper lung with fluid over the right apex unchanged. New moderate to large left pneumothorax with minimal cardiomediastinal shift to the right.  Remainder of the exam is unchanged. IMPRESSION: New moderate to large left pneumothorax with mild cardiomediastinal shift to the right. Stable changes over the right upper lung/ apex in this patient with known history of lung cancer. Critical Value/emergent results were called by telephone at the time of interpretation on 10/01/2015 at 8:22 am to Dr. Carmin Muskrat , who verbally acknowledged these results. Electronically Signed   By: Marin Olp M.D.   On: 10/01/2015 08:22   I have personally reviewed and evaluated these images and lab results as part of my medical decision-making.   EKG Interpretation   Date/Time:  Saturday October 01 2015 07:33:20 EST Ventricular Rate:  161 PR Interval:  95 QRS Duration: 104 QT Interval:  275 QTC Calculation: 450 R Axis:   78 Text Interpretation:  Sinus tachycardia Repol abnrm suggests ischemia,  diffuse leads Minimal ST elevation, lateral leads Baseline wander in  lead(s) II III aVF V3 V4 V5 Sinus tachycardia ST-t wave abnormality  Artifact Abnormal ekg Confirmed by Carmin Muskrat  MD ([id number]) on  10/01/2015 8:46:54 AM     Initial vital signs notable for tachycardia, greater than 150, regular, abnormal Pulse ox which was 99% with nasal cannula, after the patient was switched from his noninvasive pressure support.   Given the patient's decreased breath sounds, concern for congestion, he received atropine, glycopyrrolate, in addition to continuous supplemental oxygen.   Portal chest x-ray, demonstrates tension pneumothorax. Subsequent discussed this with the patient and his wife, and friend. The patient is DO NOT RESUSCITATE, DO NOT INTUBATE, he does request a chest tube for comfort.  I discussed the patient with cardiothoracic surgery - CT will be done in the ED.   10:55 AM Family back in the patient's room, they state the patient's color has returned, he appears more comfortable. I reviewed the post procedure x-ray, tube appears in the  anterior left chest.   10:59 AM Patient remains tachycardic, but w no new complaints, and states that he feels better.  MDM   Final diagnoses:  Respiratory distress  Tension pneumothorax   Patient with stage IV lung cancer, presents in respiratory distress per Initially, the patient is on noninvasive pressure support. With respiratory distress, there is suspicion for either progression of disease, underlying infection, or other pathology. Initial evaluation is notable for demonstration of a tension pneumothorax on portable x-ray. After discussion on the patient's goals of care, given that he  has advanced cancer, the patient requested placement of chest tube. After discussion with our cardiothoracic Theda Sers, we placed the chest tube in the emergency department. This was accomplished without complication. The patient had substantial increase in his comfort level, and subsequent x-ray demonstrated improvement in the characteristics of the left lung.  He required step-down admission for further e/m.  CRITICAL CARE Performed by: Carmin Muskrat Total critical care time: 40 minutes Critical care time was  exclusive of separately billable procedures and treating other patients. Critical care was necessary to treat or prevent imminent or life-threatening deterioration. Critical care was time spent personally by me on the following activities: development of treatment plan with patient and/or surrogate as well as nursing, discussions with consultants, evaluation of patient's response to treatment, examination of patient, obtaining history from patient or surrogate, ordering and performing treatments and interventions, ordering and review of laboratory studies, ordering and review of radiographic studies, pulse oximetry and re-evaluation of patient's condition.    Carmin Muskrat, MD 10/01/15 1100

## 2015-10-01 NOTE — Progress Notes (Addendum)
ANTIBIOTIC CONSULT NOTE - INITIAL  Pharmacy Consult for levaquin Indication: pneumonia  Allergies  Allergen Reactions  . Prilosec [Omeprazole] Hives and Itching  . Vicodin [Hydrocodone-Acetaminophen]     " makes me itch" i can take it if I take bendryl    Patient Measurements: Height: '6\' 6"'$  (198.1 cm) Weight: 187 lb 8 oz (85.049 kg) IBW/kg (Calculated) : 91.4 Adjusted Body Weight:   Vital Signs: Temp: 97.7 F (36.5 C) (11/26 1539) Temp Source: Oral (11/26 1539) BP: 104/77 mmHg (11/26 1539) Pulse Rate: 120 (11/26 1539) Intake/Output from previous day:   Intake/Output from this shift: Total I/O In: 240 [P.O.:240] Out: 0   Labs:  Recent Labs  10/01/15 0836  WBC 16.8*  HGB 9.9*  PLT 86*  CREATININE 1.19   Estimated Creatinine Clearance: 92.3 mL/min (by C-G formula based on Cr of 1.19). No results for input(s): VANCOTROUGH, VANCOPEAK, VANCORANDOM, GENTTROUGH, GENTPEAK, GENTRANDOM, TOBRATROUGH, TOBRAPEAK, TOBRARND, AMIKACINPEAK, AMIKACINTROU, AMIKACIN in the last 72 hours.   Microbiology: Recent Results (from the past 720 hour(s))  Culture, blood (routine x 2)     Status: None   Collection Time: 09/10/15  7:15 PM  Result Value Ref Range Status   Specimen Description BLOOD RIGHT CHEST  Final   Special Requests BOTTLES DRAWN AEROBIC AND ANAEROBIC 5ML  Final   Culture   Final    NO GROWTH 5 DAYS Performed at [facility]    Report Status 09/16/2015 FINAL  Final  Culture, blood (routine x 2)     Status: None   Collection Time: 09/10/15  8:15 PM  Result Value Ref Range Status   Specimen Description BLOOD BLOOD LEFT HAND  Final   Special Requests IN PEDIATRIC BOTTLE 1ML  Final   Culture   Final    NO GROWTH 5 DAYS Performed at [facility]    Report Status 09/16/2015 FINAL  Final  MRSA PCR Screening     Status: None   Collection Time: 09/11/15  2:08 AM  Result Value Ref Range Status   MRSA by PCR NEGATIVE NEGATIVE Final    Comment:        The  GeneXpert MRSA Assay (FDA approved for NASAL specimens only), is one component of a comprehensive MRSA colonization surveillance program. It is not intended to diagnose MRSA infection nor to guide or monitor treatment for MRSA infections.   Respiratory virus panel     Status: None   Collection Time: 09/11/15  3:06 AM  Result Value Ref Range Status   Respiratory Syncytial Virus A Negative Negative Final   Respiratory Syncytial Virus B Negative Negative Final   Influenza A Negative Negative Final   Influenza B Negative Negative Final   Parainfluenza 1 Negative Negative Final   Parainfluenza 2 Negative Negative Final   Parainfluenza 3 Negative Negative Final   Metapneumovirus Negative Negative Final   Rhinovirus Negative Negative Final   Adenovirus Negative Negative Final    Comment: (NOTE) Performed At: [facility] [place], [place] [id number] Lindon Romp MD [id number]   Urine culture     Status: None   Collection Time: 09/11/15  3:53 AM  Result Value Ref Range Status   Specimen Description URINE, RANDOM  Final   Special Requests NONE  Final   Culture   Final    MULTIPLE SPECIES PRESENT, SUGGEST RECOLLECTION Performed at [facility]    Report Status 09/12/2015 FINAL  Final  MRSA PCR Screening     Status: None  Collection Time: 10/01/15  2:01 PM  Result Value Ref Range Status   MRSA by PCR NEGATIVE NEGATIVE Final    Comment:        The GeneXpert MRSA Assay (FDA approved for NASAL specimens only), is one component of a comprehensive MRSA colonization surveillance program. It is not intended to diagnose MRSA infection nor to guide or monitor treatment for MRSA infections.     Medical History: Past Medical History  Diagnosis Date  . Lung cancer ([place])   . Bipolar disorder ([place])   . COPD (chronic obstructive pulmonary disease) ([place])   . Hypertension   . Diabetes mellitus without complication ([place])   . Brain metastases  ([place])   . Radiation     [facility], 10 days to brain, and 6 weeks to chest by Dr. Roxanne Mins    Medications:  Scheduled:  . amitriptyline  25 mg Oral QHS  . dexamethasone  2 mg Oral Daily  . docusate sodium  200 mg Oral QHS  . enoxaparin (LOVENOX) injection  40 mg Subcutaneous QHS  . fentaNYL      . folic acid  1 mg Oral Daily  . ipratropium-albuterol  3 mL Nebulization Q6H  . levofloxacin (LEVAQUIN) IV  750 mg Intravenous Q24H  . oxyCODONE  20 mg Oral Q12H  . QUEtiapine  400 mg Oral QHS  . sodium chloride      . thiamine  100 mg Oral Daily   Infusions:  . dextrose 5 % and 0.9% NaCl 100 mL/hr at 10/01/15 1406   Assessment: 49 yo male with pneumonia will be started on levaquin.  CrCl 92.  Goal of Therapy:  Resolution of infection  Plan:  - levaquin 750 mg iv q24h - follow up on ABX plan  Ellin Fitzgibbons, Tsz-Yin 10/01/2015,6:37 PM  Addendum: Changing levaquin to vancomycin and zosyn  Plan - Vancomycin 1g iv q8h - zosyn 3.375g iv q8h (4hr infusion)

## 2015-10-01 NOTE — Progress Notes (Signed)
Pt brought in to ED by EMS on cpap due to SOB and increased WOB. Per pt request, cpap taken off and placed on 5L [place]. MD and RN aware. RT will continue to monitor.

## 2015-10-02 ENCOUNTER — Inpatient Hospital Stay (HOSPITAL_COMMUNITY)

## 2015-10-02 ENCOUNTER — Encounter (HOSPITAL_COMMUNITY): Payer: Self-pay | Admitting: Certified Registered Nurse Anesthetist

## 2015-10-02 DIAGNOSIS — Z7189 Other specified counseling: Secondary | ICD-10-CM

## 2015-10-02 DIAGNOSIS — C3491 Malignant neoplasm of unspecified part of right bronchus or lung: Secondary | ICD-10-CM

## 2015-10-02 DIAGNOSIS — Z515 Encounter for palliative care: Secondary | ICD-10-CM

## 2015-10-02 DIAGNOSIS — J9312 Secondary spontaneous pneumothorax: Secondary | ICD-10-CM

## 2015-10-02 LAB — BASIC METABOLIC PANEL
ANION GAP: 7 (ref 5–15)
BUN: 16 mg/dL (ref 6–20)
CALCIUM: 8.1 mg/dL — AB (ref 8.9–10.3)
CO2: 32 mmol/L (ref 22–32)
Chloride: 98 mmol/L — ABNORMAL LOW (ref 101–111)
Creatinine, Ser: 0.91 mg/dL (ref 0.61–1.24)
GLUCOSE: 183 mg/dL — AB (ref 65–99)
POTASSIUM: 3.3 mmol/L — AB (ref 3.5–5.1)
SODIUM: 137 mmol/L (ref 135–145)

## 2015-10-02 LAB — URINALYSIS, ROUTINE W REFLEX MICROSCOPIC
Bilirubin Urine: NEGATIVE
Glucose, UA: 500 mg/dL — AB
Hgb urine dipstick: NEGATIVE
Ketones, ur: NEGATIVE mg/dL
LEUKOCYTES UA: NEGATIVE
NITRITE: NEGATIVE
PH: 5.5 (ref 5.0–8.0)
Protein, ur: NEGATIVE mg/dL
SPECIFIC GRAVITY, URINE: 1.018 (ref 1.005–1.030)

## 2015-10-02 LAB — CBC
HEMATOCRIT: 21.1 % — AB (ref 39.0–52.0)
HEMATOCRIT: 19.7 % — AB (ref 39.0–52.0)
HEMOGLOBIN: 6.8 g/dL — AB (ref 13.0–17.0)
HEMOGLOBIN: 6.2 g/dL — AB (ref 13.0–17.0)
MCH: 33.8 pg (ref 26.0–34.0)
MCH: 33.7 pg (ref 26.0–34.0)
MCHC: 32.2 g/dL (ref 30.0–36.0)
MCHC: 31.5 g/dL (ref 30.0–36.0)
MCV: 105 fL — ABNORMAL HIGH (ref 78.0–100.0)
MCV: 107.1 fL — AB (ref 78.0–100.0)
PLATELETS: 50 10*3/uL — AB (ref 150–400)
Platelets: 50 10*3/uL — ABNORMAL LOW (ref 150–400)
RBC: 2.01 MIL/uL — AB (ref 4.22–5.81)
RBC: 1.84 MIL/uL — ABNORMAL LOW (ref 4.22–5.81)
RDW: 20.6 % — AB (ref 11.5–15.5)
RDW: 20.7 % — AB (ref 11.5–15.5)
WBC: 7.8 10*3/uL (ref 4.0–10.5)
WBC: 7.7 10*3/uL (ref 4.0–10.5)

## 2015-10-02 LAB — GLUCOSE, CAPILLARY
Glucose-Capillary: 203 mg/dL — ABNORMAL HIGH (ref 65–99)
Glucose-Capillary: 173 mg/dL — ABNORMAL HIGH (ref 65–99)
Glucose-Capillary: 203 mg/dL — ABNORMAL HIGH (ref 65–99)
Glucose-Capillary: 141 mg/dL — ABNORMAL HIGH (ref 65–99)

## 2015-10-02 LAB — PREPARE RBC (CROSSMATCH)

## 2015-10-02 LAB — ABO/RH: ABO/RH(D): A NEG

## 2015-10-02 MED ORDER — SODIUM CHLORIDE 0.9 % IJ SOLN: 9.0000 mL | INTRAMUSCULAR | Status: DC | PRN

## 2015-10-02 MED ORDER — SODIUM CHLORIDE 0.9 % IV BOLUS (SEPSIS)
1000.0000 mL | Freq: Once | INTRAVENOUS | Status: AC
  Administered 2015-10-02: 1000 mL via INTRAVENOUS

## 2015-10-02 MED ORDER — DIPHENHYDRAMINE HCL 50 MG/ML IJ SOLN: 12.5000 mg | Freq: Four times a day (QID) | INTRAMUSCULAR | Status: DC | PRN

## 2015-10-02 MED ORDER — MORPHINE SULFATE 2 MG/ML IV SOLN
INTRAVENOUS | Status: DC
  Administered 2015-10-02: 1 mg via INTRAVENOUS
  Administered 2015-10-02: 17.38 mg via INTRAVENOUS
  Administered 2015-10-03: 18.86 mg via INTRAVENOUS
  Administered 2015-10-03: 10.07 mg via INTRAVENOUS
  Administered 2015-10-03: 20.86 mg via INTRAVENOUS
  Administered 2015-10-03: 05:00:00 via INTRAVENOUS
  Filled 2015-10-02 (×2): qty 25

## 2015-10-02 MED ORDER — MORPHINE SULFATE (PF) 2 MG/ML IV SOLN
2.0000 mg | INTRAVENOUS | Status: DC | PRN
  Administered 2015-10-02: 2 mg via INTRAVENOUS
  Filled 2015-10-02: qty 1

## 2015-10-02 MED ORDER — NALOXONE HCL 0.4 MG/ML IJ SOLN: 0.4000 mg | INTRAMUSCULAR | Status: DC | PRN

## 2015-10-02 MED ORDER — ONDANSETRON HCL 4 MG/2ML IJ SOLN: 4.0000 mg | Freq: Four times a day (QID) | INTRAMUSCULAR | Status: DC | PRN

## 2015-10-02 MED ORDER — SODIUM CHLORIDE 0.9 % IV SOLN
Freq: Once | INTRAVENOUS | Status: AC
  Administered 2015-10-02: 17:00:00 via INTRAVENOUS

## 2015-10-02 MED ORDER — DIPHENHYDRAMINE HCL 12.5 MG/5ML PO ELIX: 12.5000 mg | ORAL_SOLUTION | Freq: Four times a day (QID) | ORAL | Status: DC | PRN

## 2015-10-02 NOTE — Consult Note (Signed)
[address]       [place] [address]             [phone]        Glen Mendez [place] Medical Record #[id number] Date of Birth: 06-Oct-1966  Referring: Mingo Amber Primary Care: Leamon Arnt, MD  Chief Complaint:    Chief Complaint  Patient presents with  . Respiratory Distress   History of Present Illness:      Glen Mendez is a 49 yo WM with stage IV Lung Cancer with Brain metastasis.  He has been agreeable to hospice care which was arranged earlier in the month during hospitalization at [facility].  The patient was brought to the ED via EMS on yesterday with complaints of worsening shortness of breath, cough, and fatigue.  On arrival patient was in respiratory distress.  He was able to communicate and states he is always short of breath, however over the past 24 hours his symptoms had progressively worsened.  CXR in the ED showed a large left pneumothorax with evidence of tension.  A chest tube was placed and this did not completely evacuate the pneumothorax.  His chest tube was left on water seal overnight.  Currently the patient states he is breathing a little better after chest tube was placed.  He has pain at chest tube site, which is relieved with pain medication.  Palliative care has seen patient on this admission and they are to come back when a provider is available and patient is agreeable to this.     Current Activity/ Functional Status: Patient was not independent with mobility/ambulation, transfers, ADL's, IADL's.    Past Medical History  Diagnosis Date  . Lung cancer ([place])   . Bipolar disorder ([employer])   . COPD (chronic obstructive pulmonary disease) ([place])   . Hypertension   . Diabetes mellitus without complication ([place])   . Brain metastases ([place])   . Radiation     [facility], 10 days to brain, and 6 weeks to chest by Dr. Roxanne Mins    History reviewed. No pertinent past surgical history.  History  Smoking status  .  Current Every Day Smoker -- 0.50 packs/day for 30 years  . Types: Cigarettes  Smokeless tobacco  . 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 Marital Status: Married    Spouse Name: N/A  . Number of Children: 1  . Years of Education: N/A   Occupational History  . Not on file.   Social History Main Topics  . Smoking status: Current Every Day Smoker -- 0.50 packs/day for 30 years    Types: Cigarettes  . Smokeless tobacco: Never Used  . Alcohol Use: No     Comment: recovering alcholic quit [id number],   . Drug Use: No  . Sexual Activity: Not on file   Other Topics Concern  . Not on file   Social History Narrative    Allergies  Allergen Reactions  . Prilosec [Omeprazole] Hives and Itching  . Vicodin [Hydrocodone-Acetaminophen]     " makes me itch" i can take it if I take bendryl    Current Facility-Administered Medications  Medication Dose Route Frequency Provider Last Rate Last Dose  . acetaminophen (TYLENOL) tablet 650 mg  650 mg Oral Q6H PRN Elberta Leatherwood, MD  Or  . acetaminophen (TYLENOL) suppository 650 mg  650 mg Rectal Q6H PRN Elberta Leatherwood, MD      . albuterol (PROVENTIL) (2.5 MG/3ML) 0.083% nebulizer solution 2.5 mg  2.5 mg Nebulization Q4H PRN Alveda Reasons, MD      . amitriptyline (ELAVIL) tablet 25 mg  25 mg Oral QHS Elberta Leatherwood, MD   25 mg at 10/01/15 2109  . bisacodyl (DULCOLAX) suppository 10 mg  10 mg Rectal Daily PRN Elberta Leatherwood, MD      . clonazePAM [facility]) tablet 0.5 mg  0.5 mg Oral TID PRN Elberta Leatherwood, MD      . dexamethasone (DECADRON) tablet 2 mg  2 mg Oral Daily Alveda Reasons, MD   2 mg at 10/01/15 1611  . dextrose 5 %-0.9 % sodium chloride infusion   Intravenous Continuous Elberta Leatherwood, MD 100 mL/hr at 10/02/15 0600    . docusate sodium (COLACE) capsule 200 mg  200 mg Oral QHS Elberta Leatherwood, MD   200 mg at 10/01/15 2108  . enoxaparin (LOVENOX) injection 40 mg   40 mg Subcutaneous QHS Alveda Reasons, MD   40 mg at 10/01/15 2109  . folic acid (FOLVITE) tablet 1 mg  1 mg Oral Daily Elberta Leatherwood, MD   1 mg at 10/01/15 1611  . insulin aspart (novoLOG) injection 0-9 Units  0-9 Units Subcutaneous TID WC Elberta Leatherwood, MD   3 Units at 10/02/15 0800  . ipratropium-albuterol (DUONEB) 0.5-2.5 (3) MG/3ML nebulizer solution 3 mL  3 mL Nebulization Q6H Elberta Leatherwood, MD   3 mL at 10/02/15 0721  . morphine 4 MG/ML injection 4 mg  4 mg Intravenous Q2H PRN Carmin Muskrat, MD   4 mg at 10/02/15 0750  . morphine CONCENTRATE 10 MG/0.5ML oral solution 10 mg  10 mg Sublingual Q4H PRN Alveda Reasons, MD      . ondansetron [facility]) tablet 8 mg  8 mg Oral Daily PRN Elberta Leatherwood, MD      . oxyCODONE (OXYCONTIN) 12 hr tablet 20 mg  20 mg Oral Q12H Elberta Leatherwood, MD   20 mg at 10/01/15 2108  . piperacillin-tazobactam (ZOSYN) IVPB 3.375 g  3.375 g Intravenous Q8H Alveda Reasons, MD   3.375 g at 10/02/15 0542  . polyethylene glycol (MIRALAX / GLYCOLAX) packet 17 g  17 g Oral BID PRN Elberta Leatherwood, MD      . potassium chloride SA (K-DUR,KLOR-CON) CR tablet 40 mEq  40 mEq Oral BID Elberta Leatherwood, MD   40 mEq at 10/01/15 2108  . QUEtiapine (SEROQUEL) tablet 400 mg  400 mg Oral QHS Elberta Leatherwood, MD   400 mg at 10/01/15 2108  . thiamine (VITAMIN B-1) tablet 100 mg  100 mg Oral Daily Elberta Leatherwood, MD   100 mg at 10/01/15 1611  . vancomycin (VANCOCIN) IVPB 1000 mg/200 mL premix  1,000 mg Intravenous Q8H Alveda Reasons, MD   1,000 mg at 10/02/15 0542    Prescriptions prior to admission  Medication Sig Dispense Refill Last Dose  . albuterol (PROVENTIL) (5 MG/ML) 0.5% nebulizer solution Take 2.5 mg by nebulization every 4 (four) hours as needed for wheezing or shortness of breath.   over 30 days  . amitriptyline (ELAVIL) 25 MG tablet Take 25 mg by mouth at bedtime.   09/30/2015 at Unknown time  . clonazePAM (KLONOPIN) 1 MG tablet Take 0.5-1 tablets (0.5-1 mg total)  by mouth 3 (three)  times daily as needed for anxiety. 30 tablet 0 09/30/2015 at Unknown time  . dexamethasone (DECADRON) 4 MG tablet TAKE 2 TABLETS BY MOUTH 3 TIMES A DAY WITH FOOD(TAPER PER INSTRUCTIONS) (Patient taking differently: TAKE 2 TABLETS BY MOUTH EVERY 6 HOURS WITH FOOD) 30 tablet 0 09/30/2015 at Unknown time  . glipiZIDE (GLUCOTROL) 5 MG tablet Take 5 mg by mouth daily.  0 2 weeks ago  . GuaiFENesin 200 MG/10ML SOLN Take 10 mLs (200 mg total) by mouth every 4 (four) hours. For cough 840 mL  09/30/2015 at Unknown time  . ipratropium-albuterol (DUONEB) 0.5-2.5 (3) MG/3ML SOLN Take 3 mLs by nebulization every 6 (six) hours. (Patient taking differently: Take 3 mLs by nebulization every 4 (four) hours. ) 360 mL 0 09/30/2015 at Unknown time  . morphine (ROXANOL) 20 MG/ML concentrated solution Place 0.5 mLs (10 mg total) under the tongue every 3 (three) hours as needed for severe pain or shortness of breath. (Patient taking differently: Place 10 mg under the tongue every 3 (three) hours. ) 30 mL 0 09/30/2015 at Unknown time  . oxyCODONE (OXYCONTIN) 20 mg 12 hr tablet Take 1 tablet (20 mg total) by mouth every 12 (twelve) hours. 60 tablet 0 09/30/2015 at Unknown time  . polyethylene glycol (MIRALAX / GLYCOLAX) packet Take 17 g by mouth 2 (two) times daily as needed for mild constipation. 14 each 0 unknown  . QUEtiapine (SEROQUEL) 400 MG tablet Take 400 mg by mouth at bedtime.   09/30/2015 at Unknown time  . zolpidem (AMBIEN) 10 MG tablet Take 1 tablet (10 mg total) by mouth at bedtime as needed for sleep. (Patient taking differently: Take 10 mg by mouth at bedtime. ) 30 tablet 0 09/30/2015 at Unknown time  . bacitracin ointment Apply topically 2 (two) times daily. 120 g 0   . bisacodyl (DULCOLAX) 10 MG suppository Place 1 suppository (10 mg total) rectally daily as needed for severe constipation. (Patient not taking: Reported on 10/01/2015) 12 suppository 0 Not Taking at Unknown time  . docusate sodium (COLACE) 100 MG  capsule Take 2 capsules (200 mg total) by mouth at bedtime. (Patient not taking: Reported on 10/01/2015) 30 capsule 0 Not Taking at Unknown time  . levofloxacin (LEVAQUIN) 500 MG tablet Take 1 tablet (500 mg total) by mouth daily. (Patient not taking: Reported on 10/01/2015) 6 tablet 0 Not Taking at Unknown time    Family History  Problem Relation Age of Onset  . Adopted: Yes  . Family history unknown: Yes   Review of Systems:  Constitutional: positive for weight loss, fatigue Respiratory: positive for cough, dyspnea on exertion and Stage IV Lung Cancer Cardiovascular: positive for dyspnea, negative for chest pain Gastrointestinal: negative Neurological: positive for brain mets     Cardiac Review of Systems: Y or N  Chest Pain [    ]  Resting SOB [ y  ] Exertional SOB  Blue.Reese  ]  Orthopnea [  ]   Pedal Edema [   ]    Palpitations [  ] Syncope  [  ]   Presyncope [   ]  General Review of Systems: [Y] = yes [  ]=no Constitional: recent weight change [ y ]; anorexia [  ]; fatigue [ y ]; nausea [  ]; night sweats [  ]; fever [  ]; or chills [  ]  Dental: poor dentition[  ]; Last Dentist visit:   Eye : blurred vision [  ]; diplopia [   ]; vision changes [  ];  Amaurosis fugax[  ]; Resp: cough Blue.Reese  ];  wheezing[ y ];  hemoptysis[  ]; shortness of breath[ y ]; paroxysmal nocturnal dyspnea[  ]; dyspnea on exertion[  ]; or orthopnea[  ];  GI:  gallstones[  ], vomiting[  ];  dysphagia[  ]; melena[  ];  hematochezia [  ]; heartburn[  ];   Hx of  Colonoscopy[  ]; GU: kidney stones [  ]; hematuria[  ];   dysuria [  ];  nocturia[  ];  history of     obstruction [  ]; urinary frequency [  ]             Skin: rash, swelling[  ];, hair loss[  ];  peripheral edema[  ];  or itching[  ]; Musculosketetal: myalgias[  ];  joint swelling[  ];  joint erythema[  ];  joint pain[  ];  back pain[  ];  Heme/Lymph: bruising[  ];  bleeding[  ];  anemia[  ];    Neuro: TIA[  ];  headaches[  ];  stroke[  ];  vertigo[  ];  seizures[  ];   paresthesias[  ];  difficulty walking[  ];  Psych:depression[  ]; anxiety[  ];  Endocrine: diabetes[  ];  thyroid dysfunction[  ];  Immunizations: Flu [  ]; Pneumococcal[  ];  Other:  Physical Exam: BP 104/66 mmHg  Pulse 137  Temp(Src) 98.6 F (37 C) (Oral)  Resp 35  Ht '6\' 6"'$  (1.981 m)  Wt 187 lb 8 oz (85.049 kg)  BMI 21.67 kg/m2  SpO2 97%  General appearance: alert, cooperative and mild distress Head: Normocephalic, without obvious abnormality, atraumatic Resp: rhonchi scattered Cardio: regular rate and rhythm and tachy GI: soft, non-tender; bowel sounds normal; no masses,  no organomegaly Extremities: extremities normal, atraumatic, no cyanosis or edema Neurologic: Grossly normal  Diagnostic Studies & Laboratory data:     Recent Radiology Findings:   Dg Chest Port 1 View  10/01/2015  CLINICAL DATA:  49 year old with history of lung cancer. Difficulty breathing since this morning. Left pneumothorax and placement of left chest tube. EXAM: PORTABLE CHEST 1 VIEW COMPARISON:  10/01/2015 FINDINGS: A left chest tube has been placed and the tip is in the left hilar region. The left pneumothorax is still present but has decreased in size. Pneumothorax measures approximately 30-40%. Stable position of the right jugular Port-A-Cath with tip in the region of the cavoatrial junction. Chronic pleural and parenchymal densities in the right upper lung. Stable interstitial densities in the right lower lung. There appears to be less mediastinal shift towards the right. IMPRESSION: Interval placement of a left chest tube. The left pneumothorax has decreased in size as described. Chronic changes in the right chest. Electronically Signed   By: Markus Daft M.D.   On: 10/01/2015 10:10   Dg Chest Port 1 View  10/01/2015  CLINICAL DATA:  Respiratory distress.  History of lung cancer. EXAM: PORTABLE CHEST 1 VIEW COMPARISON:   09/14/2015 FINDINGS: Right IJ Port-A-Cath with tip over the right atrium unchanged. Chronic changes over the right upper lung with fluid over the right apex unchanged. New moderate to large left pneumothorax with minimal cardiomediastinal shift to the right. Remainder of the exam is unchanged. IMPRESSION: New moderate to large left pneumothorax with mild cardiomediastinal shift to the right. Stable  changes over the right upper lung/ apex in this patient with known history of lung cancer. Critical Value/emergent results were called by telephone at the time of interpretation on 10/01/2015 at 8:22 am to Dr. Carmin Muskrat , who verbally acknowledged these results. Electronically Signed   By: Marin Olp M.D.   On: 10/01/2015 08:22     I have independently reviewed the above radiologic studies.  Recent Lab Findings: Lab Results  Component Value Date   WBC 7.8 10/02/2015   HGB 6.8* 10/02/2015   HCT 21.1* 10/02/2015   PLT 50* 10/02/2015   GLUCOSE 183* 10/02/2015   ALT 26 09/14/2015   AST 27 09/14/2015   NA 137 10/02/2015   K 3.3* 10/02/2015   CL 98* 10/02/2015   CREATININE 0.91 10/02/2015   BUN 16 10/02/2015   CO2 32 10/02/2015     Assessment / Plan:    1. Stage IV Lung Cancer- presented with tension Pneumothorax- Chest tube placed by ED physician without full re-expansion of lung... Chest tube on water seal overnight...  2. Dispo- chest tube should be on suction, order placed... Will get CXR today to assess current state of pneumothorax,  Agree with Palliative care involvement      .    Grace Isaac MD      [address] [place] [address] Office [phone]   Beeper [phone]  10/02/2015 8:57 AM

## 2015-10-02 NOTE — Progress Notes (Signed)
Patient is currently a patient with [facility].  He was admitted to services on 09/19/2015 with primary dx of lung cancer.  Patient found in the bed with eyes closed awakens easily to verbal stimuli.  Reports pain to rib cage and states he has 4 cracked ribs.  No other family present at the bedside.  Chart reviewed and spoke with Judeen Hammans, RN for patient.  I will contacted Erling Conte, SW with [facility] to update her on this visit.  If there are any questions or concerns, please contact [place] [place].  Vance Gather, RN [facility]

## 2015-10-02 NOTE — Progress Notes (Signed)
CRITICAL VALUE ALERT  Critical value received: HGB 6.8  Date of notification:  27 nov  Time of notification:  0415  Critical value read back:Yes.    Nurse who received alert:  Corie Chiquito  MD notified (1st page):  DR Alease Frame  Time of first page:  0420  MD notified (2nd page):  Time of second page:  Responding MD:  Dr. Clent Ridges  Time MD responded:  0422  Also notified md PT HR was trending 130's -697'X, BP 48'A systolic ne orders received will continue to monitor

## 2015-10-02 NOTE — Progress Notes (Signed)
Family Medicine Teaching Service Daily Progress Note Intern Pager: [phone]  Patient name: Glen Mendez Medical record number: [id number] Date of birth: 1966-02-29 Age: 49 y.o. Gender: male  Primary Care Provider: Leamon Arnt, MD Consultants: Cardiothoracic, Palliative Code Status: DNR/DNI  Pt Overview and Major Events to Date:  11/26: Patient presented w/ pneumothorax, improved w/ chest tube, admitted for further care.  Assessment and Plan: Glen Mendez is a 49 y.o. male presenting with respiratory distress. 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  Respiratory distress secondary to left-sided pneumothorax: s/p left chest tube placement. Repeat chest x-ray with improved but not fully resolved pneumothorax. Continues to have tachypnea w/o hypoxia and tachycardia. In no acute distress. Pneumothorax is in the setting of recent PNA admission and current Stage 4 Small Cell Carcinoma of the Lung w/ metastatic lesions to brain, lung, and more.  - Oxygen supplementation - Chest tube placement; set to Suction; Cardiothoracic consulted in ED, appreciate the recs - Repeat chest x-ray this AM >> awaiting radiologist read but appears to have nearly resolved pneumothorax; RUL field w/ chronic changes - Follow-up respiratory status  Small cell carcinoma, lung, stage IV, brain metastasis: Patient recently placed on hospice care. During admission it was apparent that patient and family have not bought into this status, as patient appears to want care above the level of comfort. Also, family has been contacting independent cancer centers for second opinions since being turned away by [facility] for being a poor treatment candidate. - Monitor respiratory status - Consider consultation to oncology only if necessary. - Consultation to palliative care  placed; for goals of care, end-of-life care, and palliative measures.  - Greatly appreciate the recs - Spiritual care consultation placed - Pain control: Morphine (sublingual) every 4 hours PRN, OxyContin 20 mg twice a day - Increase as necessary - Decadron 2 mg daily (patient is currently on a slow taper from oncologist orders; patient is to take 2 mg every other day beginning Dec 1, with a final dose of December 8) - Discussed status with patient this morning; patient does not want to make any decisions until his wife is present. However, he did states that he would like to "live his last days at home" if possible; wife not present at this time. Will have team follow up on this today as I do not believe patient has much more time (?hour/days) and de-escalation of care is likely the most appropriate at this time.   SIRS/Sepsis: Met criteria with tachypnea, tachycardia, and leukocytosis of 16.8 on admission. Tachypnea/tachycardia can be explained by pneumothorax. However, leukocytosis appears to be new without known cause at this time, and tachypnea/tachycardia persists after chest tube placement. Pulmonary source difficult to assess w/ pneumothorax present on x-ray and current exam findings. Patient denies any dysuria. - UA unremarkable - Wound care for ulcers >> seem to be improving since last checked during previous admission. - WBC down to 7.8 (11/27) >> previous leukocytosis could have been a lab error. Strongly considering Radnor abx - Vanc/Zosyn per pharmacy (until decision made to de-escalate care) - Blood cultures: pending - Urine culture: pending - Watch for fever and/or HoTN >> provided 2L bolus overnight for soft BP  Anemia, macrocytic: 6.8 on 11/27. Likely a combination of chronic disease and acute. MCV 105.0. Likely a factor in patient's tachycardia/tachypnea. - No transfusion scheduled at this time.  If patient's Hgb drops below 6.0 then an immediate discussion w/ patient  needs to occur before a transfusion order is placed. If patient is NOT wanting to de-escalate care, provide at least 2U pRBCs at that time (consider preparing 2 additional units as well).  - H&H scheduled for 10am to track (until decision to de-escalate care is made) - Folate and Vit B12 QD   Thrombocytopenia: On admission Platelets 86; has been as low as 37 on 09/13/15. S/p 4 cycles of chemotherapy and radiation therapy -- which could have caused this suppression. Another cause would include suppression from chronic disease. Today (11/27) platelets 50 - Will continue to monitor - Lovenox for DVT prophylaxis >> will switch to Argatroban per pharmacy if platelets decrease <50 - May transition to argatroban today  Sacral decubitus ulcers: Seen on bilateral buttocks. - Will continue to monitor  - Wound care consulted >> Ulcers improved from when last seen during previous admission   Hypokalemia: On admission K+ 3.1. Currently 3.3 - Replenish as needed - 40 mEq twice a day 3 doses for now >> 2 doses remaining  Type 2 diabetes: - No medications listed for this.  - A1c pending - CGB 173 on admission - SSI sensitive  Bipolar 1 disorder: - Home Quetiapine 40 mg daily  - Home Amitriptyline 25 mg daily   Protein calorie malnutrition: - Consult dietetics   FEN/GI: D5/NS 100 mL/hour; heart healthy/carb modified diet  Prophylaxis: Lovenox >> change to argatroban if platelets drop (this will likely be required)  Disposition: Hospice care? Home Hospice Care? Comfort Care?  Subjective:  Patient reports feeling better this AM. He remembers talking w/ me yesterday in the ED. We discussed his current status. He states that he "doesn't know what to do" when asked about the possibility of de-escalating care. He would like to speak w/ his wife first before making any changes to his current treatment course.   Objective: Temp:  [97.5 F (36.4 C)-98.6 F (37 C)] 97.5 F (36.4 C) (11/27  0800) Pulse Rate:  [116-140] 137 (11/27 0800) Resp:  [19-37] 35 (11/27 0800) BP: (85-128)/(62-100) 104/66 mmHg (11/27 0800) SpO2:  [92 %-100 %] 97 % (11/27 0800) Weight:  [187 lb 8 oz (85.049 kg)] 187 lb 8 oz (85.049 kg) (11/26 1338) Physical Exam: General -- oriented x3, pleasant and cooperative. Pallor.  HEENT -- Head is normocephalic. PERRLA. EOMI. Ears, nose and throat were benign. Dry MM Chest -- poor expansion. Lungs extremely rhonchorous with pleural rub noted. Chest tube and dressing in place (L-side) Cardiac -- tachycardic. Difficult to assess additional heart sounds secondary to lung sounds. CNS -- A/Ox3, moves all 4 extremities.  Laboratory:  Recent Labs Lab 10/01/15 0836 10/02/15 0350  WBC 16.8* 7.8  HGB 9.9* 6.8*  HCT 30.6* 21.1*  PLT 86* 50*    Recent Labs Lab 10/01/15 0836 10/02/15 0350  NA 135 137  K 3.1* 3.3*  CL 92* 98*  CO2 32 32  BUN 19 16  CREATININE 1.19 0.91  CALCIUM 9.3 8.1*  GLUCOSE 173* 183*   Urine Cx: pending Blood Cx: pending   Imaging/Diagnostic Tests: CXR 11/26 IMPRESSION #1: New moderate to large left pneumothorax with mild cardiomediastinal shift to the right. Stable changes over the right upper lung/ apex in this patient with known history of lung cancer. IMPRESSION #2: Interval placement of a left chest tube. The left pneumothorax has decreased in size as described. Chronic changes in the right chest.   Elberta Leatherwood, MD  10/02/2015, 10:20 AM PGY-2, [place] Intern pager: [phone], text pages welcome

## 2015-10-02 NOTE — Consult Note (Signed)
WOC wound consult note Reason for Consult: Pressure injuries. Patient last seen by this writer in the [facility] ICU approximately 1 month ago.  (See note from that admission dated 09/10/15.)  Most wounds have reepithelialized (ischial tuberosity, elbow, finger), several scattered areas of skin loss remain in the buttock area. Wound type:pressure, moisture, shear, radiation (thermal) Pressure Ulcer POA: Yes Measurement:Several scattered areas in the buttock area of varying sizes: the largest of which measures 1cm x 1.5cm with depth undetermined due to the presence of light yellow slough and is on the right buttock and the smallest of which is 0.2cm round on the left buttock.  Two areas of recently healed pressure injuries on the bilateral ischial tuberosities are noted. Wound bed:As described above and in previous note Drainage (amount, consistency, odor) scant serous.  Patient had a soft silicone foam dressing placed, but has been incontinent of stool so this is removed in favor of cleansing and covering affected areas with our house moisture barrier ointment. Periwound:Intact, dry Dressing procedure/placement/frequency: Patient is lying on a therapeutic mattress at this time.  He did not bring his pressure redistribution chair cushion with him to the hospital, but states he has it at home. He is lying from side to side as his buttocks are uncomfortable and this positioning will help in the healing of the affected areas (which look better than they did on 11/5). Orders are provided for Nursing to cleanse at least once daily and after episodes of incontinence with EasiCleanse no Rinse, pH balanced skin cleanser and to coat bilateral buttocks and ischial tuberosities with Critic Aid Clear Moisture barrier. [place] nursing team will not follow, but will remain available to this patient, the nursing and medical teams.  Please re-consult if needed. Thanks, Maudie Flakes, MSN, RN, [place], [facility]  Pager# [phone]

## 2015-10-02 NOTE — Consult Note (Signed)
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 He recently had hospitalization for respiratory distress earlier this month. At that time outpatient hospice was discussed but it is unclear whether he actually started outpatient hospice. For this admission, respiratory distress is gradually worsened since his last discharge and acutely worsened 1 day prior to admission to the hospital.  Found to have pneumothorax and chest tube placed in ED.Palliative consulted for goals of care.  Met with patient and his wife.  He reports that most important things are being comfortable and being at home. His wife reports that she is concerned as he is currently still having a lot of pain and that he "was miserable" at home.  He understands that he is coming to the end of his life and wants to return home.  His wife reports that she had been hopeful that he would be well enough to seek more therapy, but she realizes that this is not the case.  States pain currently 9-10/10.  Reports he gets relief to 7/10 after 820m IV morphine, but pain control lasts only 2 hours.   Contacts/Participants in Discussion: Patient and his wife Primary Decision Maker: patient Relationship to Patient self HCPOA: None on chart  SUMMARY OF RECOMMENDATIONS - Patient is DNR/DNI on hospice. - His goal is to seek therapies likely to  help him feel better and get back home.  He would like to try to get home as soon as tomorrow. - Recommend transfusion of one unit of blood, but would not pursue further transfusions if he does not respond to this unit.  He is agreeable to this and hopefully will symptomatically feel better after transfusion. - Plan for initiation of PCA.  He is fearful of another episode of dyspnea, and will benefit from being able to rapidly treat with parenteral opioid if recurs. - Would not plan to continue abx long term. - Will need to see what hospice capability is regarding chest tube as well as decide if this should remain on d/c.  Code Status/Advance Care Planning: DNR    Code Status Orders        Start     Ordered   10/01/15 1338  Do not attempt resuscitation (DNR)   Continuous    Question Answer Comment  In the event of cardiac or respiratory ARREST Do not call a "code blue"   In the event of cardiac or respiratory ARREST Do not perform Intubation, CPR, defibrillation or ACLS   In the event of cardiac or respiratory ARREST Use medication by any route, position, wound care, and other measures to relive pain and suffering. May use oxygen, suction and manual treatment of airway obstruction as needed for comfort.      10/01/15 1337     Symptom Management:   Dyspnea: Plan for initiation of PCA.  Morphine 145mhr and 20m89molus with 20 minute lockout.  Palliative Prophylaxis:   Bowel Regimen and Frequent Pain Assessment  Psycho-social/Spiritual:  Support System: Strong Desire for further Chaplaincy support:No Additional  Recommendations: Grief/Bereavement Support  Prognosis: Hours - Days  Discharge Planning: Home with Hospice   Chief Complaint/ Primary Diagnoses: Present on Admission:  . Respiratory distress  I have reviewed the medical record, interviewed the patient and family, and examined the patient. The following aspects are pertinent.  Past Medical History  Diagnosis Date  .  Lung cancer ([place])   . Bipolar disorder ([place])   . COPD (chronic obstructive pulmonary disease) ([place])   . Hypertension   . Diabetes mellitus without complication ([place])   . Brain metastases ([place])   . Radiation     [facility], 10 days to brain, and 6 weeks to chest by Dr. Roxanne Mins   Social History   Social History  . Marital Status: Married    Spouse Name: N/A  . Number of Children: 1  . Years of Education: N/A   Social History Main Topics  . Smoking status: Current Every Day Smoker -- 0.50 packs/day for 30 years    Types: Cigarettes  . Smokeless tobacco: Never Used  . 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 Drug Use: No  . Sexual Activity: Not Asked   Other Topics Concern  . None   Social History Narrative   Family History  Problem Relation Age of Onset  . Adopted: Yes  . Family history unknown: Yes   Scheduled Meds: . sodium chloride   Intravenous Once  . amitriptyline  25 mg Oral QHS  . dexamethasone  2 mg Oral Daily  . docusate sodium  200 mg Oral QHS  . enoxaparin (LOVENOX) injection  40 mg Subcutaneous QHS  . folic acid  1 mg Oral Daily  . insulin aspart  0-9 Units Subcutaneous TID WC  . ipratropium-albuterol  3 mL Nebulization Q6H  . morphine   Intravenous 6 times per day  . potassium chloride  40 mEq Oral BID  . QUEtiapine  400 mg Oral QHS  . thiamine  100 mg Oral Daily   Continuous Infusions: . dextrose 5 % and 0.9% NaCl 100 mL/hr at 10/02/15 0941   PRN Meds:.acetaminophen **OR** acetaminophen, albuterol, bisacodyl, clonazePAM, diphenhydrAMINE **OR** diphenhydrAMINE, morphine injection, naloxone **AND** sodium chloride, ondansetron (ZOFRAN) IV, ondansetron, polyethylene glycol Medications Prior to Admission:  Prior to Admission medications   Medication Sig Start Date End Date Taking? Authorizing Provider  albuterol (PROVENTIL) (5 MG/ML) 0.5% nebulizer solution Take 2.5 mg by nebulization every 4 (four) hours  as needed for wheezing or shortness of breath.   Yes Historical Provider, MD  amitriptyline (ELAVIL) 25 MG tablet Take 25 mg by mouth at bedtime.   Yes Historical Provider, MD  clonazePAM (KLONOPIN) 1 MG tablet Take 0.5-1 tablets (0.5-1 mg total) by mouth 3 (three) times daily as needed for anxiety. 09/19/15  Yes Curt Bears, MD  dexamethasone (DECADRON) 4 MG tablet TAKE 2 TABLETS BY MOUTH 3 TIMES A DAY WITH FOOD(TAPER PER INSTRUCTIONS) Patient taking differently: TAKE 2 TABLETS BY MOUTH EVERY 6 HOURS WITH FOOD 09/19/15  Yes Eppie Gibson, MD  glipiZIDE (GLUCOTROL) 5 MG tablet Take 5 mg by mouth daily. 07/18/15  Yes Historical Provider, MD  GuaiFENesin 200 MG/10ML SOLN Take 10 mLs (200 mg total) by mouth every 4 (four) hours. For cough 09/28/15  Yes Everrett Coombe, MD  ipratropium-albuterol (DUONEB) 0.5-2.5 (3) MG/3ML SOLN Take 3 mLs by nebulization every 6 (six) hours. Patient taking differently: Take 3 mLs by nebulization every 4 (four) hours.  09/14/15  Yes Marijean Heath, NP  morphine (ROXANOL) 20 MG/ML concentrated solution Place 0.5 mLs (10 mg total) under the tongue every 3 (three) hours as needed for severe pain or shortness of breath. Patient taking differently: Place 10 mg under the tongue every 3 (three) hours.  09/26/15  Yes Curt Bears, MD  oxyCODONE (OXYCONTIN) 20 mg 12 hr tablet Take 1 tablet (20 mg total) by mouth every 12 (twelve) hours. 09/19/15  Yes Curt Bears, MD  polyethylene glycol [facility] / GLYCOLAX) packet Take 17 g by mouth 2 (two) times daily as needed for mild constipation. 07/18/15  Yes Debbe Odea, MD  QUEtiapine (SEROQUEL) 400 MG tablet Take 400 mg by mouth at bedtime.   Yes Historical Provider, MD  zolpidem (AMBIEN) 10 MG tablet Take 1 tablet (10 mg total) by mouth at bedtime as needed for sleep. Patient taking differently: Take 10 mg by mouth at bedtime.  09/19/15  Yes Curt Bears, MD  bacitracin ointment Apply topically 2 (two) times daily.  09/14/15   Marijean Heath, NP  bisacodyl (DULCOLAX) 10 MG suppository Place 1 suppository (10 mg total) rectally daily as needed for severe constipation. Patient not taking: Reported on 10/01/2015 07/18/15   Debbe Odea, MD  docusate sodium (COLACE) 100 MG capsule Take 2 capsules (200 mg total) by mouth at bedtime. Patient not taking: Reported on 10/01/2015 07/18/15   Debbe Odea, MD  levofloxacin (LEVAQUIN) 500 MG tablet Take 1 tablet (500 mg total) by mouth daily. Patient not taking: Reported on 10/01/2015 09/14/15   Marijean Heath, NP   Allergies  Allergen Reactions  . Prilosec [Omeprazole] Hives and Itching  . Vicodin [Hydrocodone-Acetaminophen]     " makes me itch" i can take it if I take bendryl    Review of Systems  Constitutional: Positive for activity change, appetite change and fatigue.  HENT: Positive for congestion.   Respiratory: Positive for shortness of breath and wheezing.   Cardiovascular: Positive for chest pain.    Physical Exam  Constitutional: No distress.  HENT:  Head: Normocephalic.  Mouth/Throat: No oropharyngeal exudate.  Eyes: Conjunctivae are normal. Right eye exhibits no discharge. Left eye exhibits no discharge. No scleral icterus.  Neck: No tracheal deviation present.  Cardiovascular: Normal rate.   Murmur heard. Respiratory: Stridor present. He is in respiratory distress. He has wheezes. He has rales.  GI: Soft. He exhibits no distension. There is no tenderness.  Musculoskeletal: He exhibits no edema.  Neurological: He is alert. No cranial nerve deficit.  Skin: Skin is warm and dry. He is not diaphoretic.  Psychiatric: He has a normal mood and affect. Thought content normal.    Vital Signs: BP 102/71 mmHg  Pulse 130  Temp(Src) 98 F (36.7 C) (Oral)  Resp 30  Ht '6\' 6"'  (1.981 m)  Wt 85.049 kg (187 lb 8 oz)  BMI 21.67 kg/m2  SpO2 99%  SpO2: SpO2: 99 % O2 Device:SpO2: 99 % O2 Flow Rate: .O2 Flow Rate (L/min): 2.5 L/min  IO:  Intake/output summary:  Intake/Output Summary (Last 24 hours) at 10/02/15 1445 Last data filed at 10/02/15 0941  Gross per 24 hour  Intake   1890 ml  Output    995 ml  Net    895 ml    LBM: Last BM Date: 10/01/15 Baseline Weight: Weight: 85.049 kg (187 lb 8 oz) Most recent weight: Weight: 85.049 kg (187 lb 8 oz)      Palliative Assessment/Data:  Flowsheet Rows  Most Recent Value   Intake Tab    Referral Department  -- [FM Teaching service]   Unit at Time of Referral  ICU   Palliative Care Primary Diagnosis  Cancer   Date Notified  10/01/15   Palliative Care Type  Return patient Palliative Care   Reason for referral  Pain, End of Life Care Assistance, Non-pain Symptom, Clarify Goals of Care   Date of Admission  10/01/15   Date first seen by Palliative Care  10/02/15   # of days Palliative referral response time  1 Day(s)   # of days IP prior to Palliative referral  0   Clinical Assessment    Palliative Performance Scale Score  50%   Pain Max last 24 hours  10   Pain Min Last 24 hours  7   Psychosocial & Spiritual Assessment    Palliative Care Outcomes       Additional Data Reviewed:  CBC:    Component Value Date/Time   WBC 7.8 10/02/2015 0350   WBC 5.1 08/17/2015 1510   HGB 6.8* 10/02/2015 0350   HGB 12.4* 08/17/2015 1510   HCT 21.1* 10/02/2015 0350   HCT 35.9* 08/17/2015 1510   PLT 50* 10/02/2015 0350   PLT 50* 08/17/2015 1510   MCV 105.0* 10/02/2015 0350   MCV 98.9* 08/17/2015 1510   NEUTROABS 14.6* 10/01/2015 0836   NEUTROABS 4.8 08/17/2015 1510   LYMPHSABS 1.2 10/01/2015 0836   LYMPHSABS 0.2* 08/17/2015 1510   MONOABS 0.8 10/01/2015 0836   MONOABS 0.1 08/17/2015 1510   EOSABS 0.0 10/01/2015 0836   EOSABS 0.0 08/17/2015 1510   BASOSABS 0.2* 10/01/2015 0836   BASOSABS 0.0 08/17/2015 1510   Comprehensive Metabolic Panel:    Component Value Date/Time   NA 137 10/02/2015 0350   NA 130* 08/17/2015 1510   K 3.3* 10/02/2015 0350   K 3.6 08/17/2015  1510   CL 98* 10/02/2015 0350   CO2 32 10/02/2015 0350   CO2 30* 08/17/2015 1510   BUN 16 10/02/2015 0350   BUN 23.9 08/17/2015 1510   CREATININE 0.91 10/02/2015 0350   CREATININE 1.3 08/17/2015 1510   GLUCOSE 183* 10/02/2015 0350   GLUCOSE 101 08/17/2015 1510   CALCIUM 8.1* 10/02/2015 0350   CALCIUM 8.2* 08/17/2015 1510   AST 27 09/14/2015 0414   AST 40* 08/17/2015 1510   ALT 26 09/14/2015 0414   ALT 60* 08/17/2015 1510   ALKPHOS 72 09/14/2015 0414   ALKPHOS 67 08/17/2015 1510   BILITOT 1.0 09/14/2015 0414   BILITOT 0.59 08/17/2015 1510   PROT 4.7* 09/14/2015 0414   PROT 5.4* 08/17/2015 1510   ALBUMIN 1.9* 09/14/2015 0414   ALBUMIN 2.9* 08/17/2015 1510     Time In: 1335 Time Out: 1445 Time Total: 70 Greater than 50%  of this time was spent counseling and coordinating care related to the above assessment and plan.  Signed by: Micheline Rough, MD  Micheline Rough, MD  10/02/2015, 2:45 PM  Please contact Palliative Medicine Team phone at [phone] for questions and concerns.

## 2015-10-02 NOTE — Progress Notes (Signed)
Utilization review completed.  

## 2015-10-03 ENCOUNTER — Inpatient Hospital Stay (HOSPITAL_COMMUNITY)

## 2015-10-03 DIAGNOSIS — J9312 Secondary spontaneous pneumothorax: Secondary | ICD-10-CM

## 2015-10-03 DIAGNOSIS — J939 Pneumothorax, unspecified: Secondary | ICD-10-CM

## 2015-10-03 DIAGNOSIS — G893 Neoplasm related pain (acute) (chronic): Secondary | ICD-10-CM

## 2015-10-03 LAB — URINE CULTURE

## 2015-10-03 LAB — TYPE AND SCREEN
ABO/RH(D): A NEG
ANTIBODY SCREEN: NEGATIVE
UNIT DIVISION: 0

## 2015-10-03 LAB — GLUCOSE, CAPILLARY
GLUCOSE-CAPILLARY: 131 mg/dL — AB (ref 65–99)
GLUCOSE-CAPILLARY: 192 mg/dL — AB (ref 65–99)
GLUCOSE-CAPILLARY: 144 mg/dL — AB (ref 65–99)
Glucose-Capillary: 193 mg/dL — ABNORMAL HIGH (ref 65–99)
Glucose-Capillary: 150 mg/dL — ABNORMAL HIGH (ref 65–99)

## 2015-10-03 LAB — HEMOGLOBIN AND HEMATOCRIT, BLOOD
HEMATOCRIT: 23.1 % — AB (ref 39.0–52.0)
Hemoglobin: 7.6 g/dL — ABNORMAL LOW (ref 13.0–17.0)

## 2015-10-03 LAB — HEMOGLOBIN A1C
HEMOGLOBIN A1C: 5.8 % — AB (ref 4.8–5.6)
MEAN PLASMA GLUCOSE: 120 mg/dL

## 2015-10-03 MED ORDER — POLYETHYLENE GLYCOL 3350 17 G PO PACK
17.0000 g | PACK | Freq: Every day | ORAL | Status: DC
  Filled 2015-10-03: qty 1

## 2015-10-03 MED ORDER — SENNA 8.6 MG PO TABS
1.0000 | ORAL_TABLET | Freq: Two times a day (BID) | ORAL | Status: DC
  Administered 2015-10-03: 8.6 mg via ORAL
  Filled 2015-10-03 (×3): qty 1

## 2015-10-03 MED ORDER — BOOST PLUS PO LIQD
237.0000 mL | Freq: Three times a day (TID) | ORAL | Status: DC
  Administered 2015-10-03 – 2015-10-04 (×4): 237 mL via ORAL
  Filled 2015-10-03 (×7): qty 237

## 2015-10-03 MED ORDER — POLYETHYLENE GLYCOL 3350 17 G PO PACK: 17.0000 g | PACK | Freq: Every day | ORAL | Status: DC | PRN

## 2015-10-03 MED ORDER — MORPHINE SULFATE 2 MG/ML IV SOLN
INTRAVENOUS | Status: DC
  Administered 2015-10-03: 14.26 mg via INTRAVENOUS
  Administered 2015-10-03: 15:00:00 via INTRAVENOUS
  Administered 2015-10-03: 7.17 mg via INTRAVENOUS
  Administered 2015-10-03: 22:00:00 via INTRAVENOUS
  Administered 2015-10-04: 23.62 mg via INTRAVENOUS
  Administered 2015-10-04: 28.3 mg via INTRAVENOUS
  Administered 2015-10-04: 12.91 mg via INTRAVENOUS
  Administered 2015-10-04: 09:00:00 via INTRAVENOUS
  Filled 2015-10-03 (×3): qty 25

## 2015-10-03 NOTE — Progress Notes (Addendum)
Initial Nutrition Assessment  DOCUMENTATION CODES:   Severe malnutrition in context of chronic illness  INTERVENTION:    Boost Plus PO TID, each supplement provides 360 kcal and 14 gm protein  Appetite stimulant per MD if able; wife concerned that patient is eating very poorly  NUTRITION DIAGNOSIS:   Malnutrition related to chronic illness as evidenced by severe depletion of muscle mass, percent weight loss (13% weight loss within 6 months).  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 S/P chest tube placement.  Labs reviewed: potassium low  Spoke with patient and his wife about usual intake. Wife reports that he has been eating barely anything and would like him to receive a medication to stimulate his appetite. Patient has asked RN for a Boost supplement; she has put it in the refrigerator for later because patient wants it cold. Pudding at bedside untouched. Chest tube in place. Plans for d/c home with Hospice care. Patient with sever PCM. Patient with increased needs due to [facility], multiple wounds, chronic catabolic illness, however, doubt he will be able to meet nutrition needs. Palliative Care Team is following.  Diet Order:  Diet heart healthy/carb modified Room service appropriate?: Yes; Fluid consistency:: Thin  Skin:  Wound (see comment) (stg 2 buttocks&elbow; DTI ankle; stg 3 isc tub; unstg finger)  Last BM:  11/26  Height:   Ht Readings from Last 1 Encounters:  10/01/15 '6\' 6"'$  (1.981 m)    Weight:   Wt  Readings from Last 1 Encounters:  10/01/15 187 lb 8 oz (85.049 kg)    Ideal Body Weight:  97.3 kg  BMI:  Body mass index is 21.67 kg/(m^2).  Estimated Nutritional Needs:   Kcal:  2500  Protein:  160 gm  Fluid:  2.5 L  EDUCATION NEEDS:   No education needs identified at this time  Molli Barrows, [place], [place], [place] Pager [phone] After Hours Pager [phone]

## 2015-10-03 NOTE — Progress Notes (Signed)
Family Medicine Teaching Service Daily Progress Note Intern Pager: [phone]  Patient name: Glen Mendez Medical record number: [id number] Date of birth: 29-Apr-1966 Age: 49 y.o. Gender: male  Primary Care Provider: Leamon Arnt, MD Consultants: Cardiothoracic, Palliative Code Status: DNR/DNI  Pt Overview and Major Events to Date:  11/26: Patient presented w/ pneumothorax, improved w/ chest tube, admitted for further care.  Assessment and Plan: Glen Mendez is a 49 y.o. male presenting with respiratory distress. 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  Respiratory distress secondary to left-sided pneumothorax: s/p left chest tube placement. Repeat chest x-ray with improved but not fully resolved pneumothorax. Continues to have tachypnea w/o hypoxia and tachycardia. In no acute distress. Pneumothorax is in the setting of recent PNA admission and current Stage 4 Small Cell Carcinoma of the Lung w/ metastatic lesions to brain, lung, and more.  - Oxygen supplementation - Chest tube placement 11/26; set on suction; Cardiothoracic consulted, appreciate the recs: Chest tube to water seal today, follow up xray in the am, if pt desires could go home with chest tube to water seal if CXR okay tomorrow  - Follow-up respiratory status - Duoneb q 6 hrs scheduled - Morphine PCA  Small cell carcinoma, lung, stage IV, brain metastasis: Patient recently placed on hospice care. During admission it was apparent that patient and family have not bought into this status, as patient appears to want care above the level of comfort. Also, family has been contacting independent cancer centers for second opinions since being turned away by [facility] for being a poor treatment candidate. - Monitor respiratory status - Spiritual care consultation placed - Pain  control: Morphine (sublingual) every 4 hours PRN, OxyContin 20 mg twice a day - Increase as necessary - Decadron 2 mg daily (patient is currently on a slow taper from oncologist orders; patient is to take 2 mg every other day beginning Dec 1, with a final dose of December 8)  - Palliative care following, appreciate reccs:   - His goal is to seek therapies likely to help him feel better and get back home. He would like to try to get home as soon as tomorrow.  - Plan for initiation of PCA.   - Would not plan to continue abx long term.  - Will need to see what hospice capability is regarding chest tube as well as decide if this should remain on d/c.  SIRS/Sepsis: Met criteria with tachypnea, tachycardia, and leukocytosis of 16.8 on admission. Tachypnea/tachycardia can be explained by pneumothorax. However, leukocytosis appears to be new without known cause at this time, and tachypnea/tachycardia persists after chest tube placement. Pulmonary source difficult to assess w/ pneumothorax present on x-ray and current exam findings. Patient denies any dysuria.  Continues to have tachycardia although overall improved and Tachypnea is also improving.  - UA unremarkable - Wound care for ulcers >> seem to be improving since last checked during previous admission. - WBC 7.8 (11/27)  - Vanc/Zosyn per (discontinued 11/27) - Blood cultures: pending - Urine culture: pending - Watch for fever and/or HoTN >> provided 2L bolus overnight for soft BP  Anemia, macrocytic: 6.8 on 11/27. Likely a combination of chronic disease and acute. MCV 105.0. Likely a factor in patient's tachycardia/tachypnea. - Folate and Vit B12 QD  - s/p 1 unit pRBC 11/27 > hgb 7.6  Thrombocytopenia: On admission Platelets 86;  has been as low as 37 on 09/13/15. S/p 4 cycles of chemotherapy and radiation therapy -- which could have caused this suppression. Another cause would include suppression from chronic disease. Today (11/27)  platelets 50 - Will continue to monitor - Lovenox for DVT prophylaxis >> will switch to Argatroban per pharmacy if platelets decrease <50 - May transition to argatroban today  Sacral decubitus ulcers: Seen on bilateral buttocks. - Will continue to monitor  - Wound care consulted >> Ulcers improved from when last seen during previous admission   Hypokalemia: On admission K+ 3.1. Currently 3.3 - Replenish as needed - 40 mEq twice a day 3 doses for now >> 2 doses remaining  Type 2 diabetes: - No medications listed for this.  - A1c pending - CGB 173 on admission - SSI sensitive  Bipolar 1 disorder: - Home Quetiapine 40 mg daily  - Home Amitriptyline 25 mg daily   Protein calorie malnutrition: - Consult dietetics  FEN/GI: D5/NS 100 mL/hour; heart healthy/carb modified diet  Prophylaxis: Lovenox >> change to argatroban if platelets drop (this will likely be required)  Disposition: Home with home hospice possibly 11/29 if CXR shows improvement of pneumothorax per surgery   Subjective:  - no concerns today - has some shortness of breath but doing well with breathing treatments   Objective: Temp:  [97.5 F (36.4 C)-98.4 F (36.9 C)] 98 F (36.7 C) (11/28 0447) Pulse Rate:  [111-137] 112 (11/28 0447) Resp:  [15-35] 15 (11/28 0447) BP: (102-128)/(62-100) 107/69 mmHg (11/28 0447) SpO2:  [94 %-100 %] 99 % (11/28 0447) Physical Exam: General -- oriented x3, pleasant Chest -- On 2.5 [place]. Lungs extremely rhonchorous bilatearally. Chest tube and dressing in place (L-side) Cardiac -- Difficult to assess additional heart sounds secondary to lung sounds. CNS -- A/Ox3, moves all 4 extremities.  Laboratory:  Recent Labs Lab 10/01/15 0836 10/02/15 0350 10/02/15 1413 10/03/15 0225  WBC 16.8* 7.8 7.7  --   HGB 9.9* 6.8* 6.2* 7.6*  HCT 30.6* 21.1* 19.7* 23.1*  PLT 86* 50* 50*  --     Recent Labs Lab 10/01/15 0836 10/02/15 0350  NA 135 137  K 3.1* 3.3*  CL 92* 98*  CO2  32 32  BUN 19 16  CREATININE 1.19 0.91  CALCIUM 9.3 8.1*  GLUCOSE 173* 183*   Urine Cx: pending Blood Cx: pending   Imaging/Diagnostic Tests: CXR 11/26 IMPRESSION #1: New moderate to large left pneumothorax with mild cardiomediastinal shift to the right. Stable changes over the right upper lung/ apex in this patient with known history of lung cancer. IMPRESSION #2: Interval placement of a left chest tube. The left pneumothorax has decreased in size as described. Chronic changes in the right chest.   Smiley Houseman, MD 10/03/2015, 6:37 AM PGY-1, [place] Intern pager: [phone], text pages welcome

## 2015-10-03 NOTE — Progress Notes (Addendum)
[address]       [place] [address]             [phone]          Subjective: Somewhat confused, alert and pleasant  Objective: Vital signs in last 24 hours: Temp:  [97.5 F (36.4 C)-98.4 F (36.9 C)] 98 F (36.7 C) (11/28 0447) Pulse Rate:  [111-137] 112 (11/28 0447) Cardiac Rhythm:  [-] Sinus tachycardia (11/28 0447) Resp:  [15-35] 15 (11/28 0447) BP: (102-117)/(62-76) 107/69 mmHg (11/28 0447) SpO2:  [94 %-100 %] 99 % (11/28 0447)  Hemodynamic parameters for last 24 hours:    Intake/Output from previous day: 11/27 0701 - 11/28 0700 In: 2750 [P.O.:300; I.V.:2115; Blood:335] Out: 2360 [Urine:2300; Chest Tube:60] Intake/Output this shift:    General appearance: alert, cooperative, distracted and no distress Heart: regular rate and rhythm Lungs: coarse throughout Abdomen: soft Extremities: no edema  Lab Results:  Recent Labs  10/02/15 0350 10/02/15 1413 10/03/15 0225  WBC 7.8 7.7  --   HGB 6.8* 6.2* 7.6*  HCT 21.1* 19.7* 23.1*  PLT 50* 50*  --    BMET:  Recent Labs  10/01/15 0836 10/02/15 0350  NA 135 137  K 3.1* 3.3*  CL 92* 98*  CO2 32 32  GLUCOSE 173* 183*  BUN 19 16  CREATININE 1.19 0.91  CALCIUM 9.3 8.1*    PT/INR: No results for input(s): LABPROT, INR in the last 72 hours. ABG    Component Value Date/Time   HCO3 25.0* 09/11/2015 0250   TCO2 24.0 09/11/2015 0250   ACIDBASEDEF 0.1 09/11/2015 0250   O2SAT 70.7 09/11/2015 0250   CBG (last 3)   Recent Labs  10/02/15 1131 10/02/15 1624 10/02/15 2118  GLUCAP 173* 203* 141*    Meds Scheduled Meds: . amitriptyline  25 mg Oral QHS  . dexamethasone  2 mg Oral Daily  . docusate sodium  200 mg Oral QHS  . folic acid  1 mg Oral Daily  . insulin aspart  0-9 Units Subcutaneous TID WC  . ipratropium-albuterol  3 mL Nebulization Q6H  . morphine   Intravenous 6 times per day  . potassium chloride  40 mEq Oral BID  . QUEtiapine  400 mg Oral QHS  . thiamine   100 mg Oral Daily   Continuous Infusions: . dextrose 5 % and 0.9% NaCl 100 mL/hr at 10/02/15 2115   PRN Meds:.acetaminophen **OR** acetaminophen, albuterol, bisacodyl, clonazePAM, diphenhydrAMINE **OR** diphenhydrAMINE, morphine injection, naloxone **AND** sodium chloride, ondansetron (ZOFRAN) IV, ondansetron, polyethylene glycol  Xrays Dg Chest Port 1 View  10/03/2015  CLINICAL DATA:  Left pneumothorax. EXAM: PORTABLE CHEST 1 VIEW COMPARISON:  10/02/2015 and CT chest 08/18/2015. FINDINGS: Trachea is deviated slightly to the right. Right IJ power port tip is in the right atrium. Heart size stable. Right upper lobe collapse/consolidation with areas of internal lucency, unchanged and better evaluated on cross-sectional imaging 08/18/2015. Left chest tube is in place with a small left pneumothorax, stable to very minimally increased from 10/02/2015. Nodularity throughout the left hemi thorax, as before. No definite pleural fluid. Subcutaneous air along the lower left chest wall. IMPRESSION: 1. Post treatment changes in the right upper lobe with scattered nodularity in the left lung, better evaluated on cross-sectional imaging 08/18/2015. 2. Small left pneumothorax, stable to minimally increased from 10/02/2015, with left chest tube in place. Electronically Signed   By: Lorin Picket M.D.   On: 10/03/2015 07:28   Dg Chest  Port 1 View  10/02/2015  CLINICAL DATA:  LEFT pneumothorax EXAM: PORTABLE CHEST 1 VIEW COMPARISON:  10/01/2015 FINDINGS: RIGHT power port unchanged. Stable cardiac silhouette. There is opacification of the RIGHT lung apex similar to comparison exam. No discrete pneumothorax. There is patchy airspace opacity in the LEFT lung. LEFT chest tube in place. There is interval expansion of the LEFT lung with decreased pneumothorax. Trace lateral pneumothorax the LEFT. The apical pneumothorax on the LEFT measures 13 mm from the apical chest wall and decreased from 25 mm. IMPRESSION: 1. Reduction  in volume of LEFT pneumothorax with chest tube in place. 2. Stable apical density in the RIGHT upper lobe. 3. Mild airspace disease within the LEFT lung.  No change. Electronically Signed   By: Suzy Bouchard M.D.   On: 10/02/2015 10:13   Dg Chest Port 1 View  10/01/2015  CLINICAL DATA:  49 year old with history of lung cancer. Difficulty breathing since this morning. Left pneumothorax and placement of left chest tube. EXAM: PORTABLE CHEST 1 VIEW COMPARISON:  10/01/2015 FINDINGS: A left chest tube has been placed and the tip is in the left hilar region. The left pneumothorax is still present but has decreased in size. Pneumothorax measures approximately 30-40%. Stable position of the right jugular Port-A-Cath with tip in the region of the cavoatrial junction. Chronic pleural and parenchymal densities in the right upper lung. Stable interstitial densities in the right lower lung. There appears to be less mediastinal shift towards the right. IMPRESSION: Interval placement of a left chest tube. The left pneumothorax has decreased in size as described. Chronic changes in the right chest. Electronically Signed   By: Markus Daft M.D.   On: 10/01/2015 10:10   Dg Chest Port 1 View  10/01/2015  CLINICAL DATA:  Respiratory distress.  History of lung cancer. EXAM: PORTABLE CHEST 1 VIEW COMPARISON:  09/14/2015 FINDINGS: Right IJ Port-A-Cath with tip over the right atrium unchanged. Chronic changes over the right upper lung with fluid over the right apex unchanged. New moderate to large left pneumothorax with minimal cardiomediastinal shift to the right. Remainder of the exam is unchanged. IMPRESSION: New moderate to large left pneumothorax with mild cardiomediastinal shift to the right. Stable changes over the right upper lung/ apex in this patient with known history of lung cancer. Critical Value/emergent results were called by telephone at the time of interpretation on 10/01/2015 at 8:22 am to Dr. Carmin Muskrat ,  who verbally acknowledged these results. Electronically Signed   By: Marin Olp M.D.   On: 10/01/2015 08:22    Assessment/Plan:  1 CXR stable to slightly increased left pneumotx, no air leak- cont suction for now      LOS: 2 days    GOLD,WAYNE E 10/03/2015  No air leak- small residual ptx , ct to water seal today, follow up xray in am , if patient desires could go home with ct to water seal if chest xray is ok tomorrow pca pump better for pain control per patient I have seen and examined [name] and agree with the above assessment  and plan.  Grace Isaac MD Beeper [phone] Office [phone] 10/03/2015 2:11 PM

## 2015-10-03 NOTE — Progress Notes (Signed)
   10/03/15 1500  Clinical Encounter Type  Visited With Patient and family together  Visit Type Spiritual support  Referral From Nurse  Spiritual Encounters  Spiritual Needs Prayer;Emotional;Grief support  Stress Factors  Patient Stress Factors Loss of control;Health changes;Loss  Family Stress Factors Exhausted;Loss;Major life changes  Patient to go home tomorrow with Hospice, but wanted prayer. Waiting on call back from their [place] priest. Wife indicated she is exhausted and knows her life will never be the same. Daughter said nothing, nor did best friend. Patient expressed happiness at going home.

## 2015-10-03 NOTE — Progress Notes (Signed)
Pt anxious to be discharged home.  Contacted hospice hospital liaison, Becky Augusta regarding request by palliative medicine to have hospice RN contact him regarding plans for pt's care at home.  Pt was brought to the hospital via 911 following episode of pain and shortness of breath.  Family did not contact hospice until after 911 call.  Pt was admitted to hospice after last hospital stay.  He was sent home with a DNR, however pt revoked the DNR once he was home stating that he wanted to be a full code and he would want to go back to the hospital if he is in distress.  [place], [place] ext: [phone]

## 2015-10-03 NOTE — Progress Notes (Signed)
Hospice and [facility]) Chaplain Visit:  Pt awake in bed with no visitors, recognizing chaplain from home visit, and welcoming spiritual support. Pt quick to relate he was ready to go home, that "I'm dying, but at least I get to go home, as soon as they work out the machine to give me pain medicine by pushing the button."  Pt related that he believes that his priest Fr Duke at [employer] is aware of his status.  Pt shared his concerns for his wife [name] and his 56 yo daughter [name].  Pt fatigued easily and was thankful for prayer of blessing and peace.  Chaplain to follow up on pt's discharge home with hospice care. Glen Mendez, ThM, [place]

## 2015-10-03 NOTE — Progress Notes (Signed)
Education given to patient about benefits of turning and repositioning, with emphasis on preventing pressure sores. Pt states he understands and does not wish to be turned at this time. Pt pulled up in bed. Will continue to monitor.
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
## 2015-10-03 NOTE — Progress Notes (Signed)
GIP RN visit -  [facility]  Patient is alert, pleasant.   He is lying in the bed awake. Patient reports that he is feeling much better and is breathing much easier than yesterday. He has a foley catheter in place.    He has a morphine PCA pump infusing but is still reporting pain to the left ribcage and says it is an 8.  He stated he has not pushed his bolus button recently though.   Patient's goal is to go home but says he cannot go home until tomorrow.  He lives with his wife. He is bedbound and has been for quite some time.  He has a hospital bed at home.  He has a left sided chest tube to water seal drainage in place. Discussion with Dr. Domingo Cocking that the patient may be discharged to  Home with both PCA pump and chest tube.    I did speak with Carolynn Sayers from [facility] to make her aware patient will need PCA pump for discharge tomorrow.  Discussed case with hospice clinical manager -   [facility] will be able to care for the patient in the home with the chest tube if necessary.

## 2015-10-04 ENCOUNTER — Inpatient Hospital Stay (HOSPITAL_COMMUNITY)

## 2015-10-04 DIAGNOSIS — Z515 Encounter for palliative care: Secondary | ICD-10-CM | POA: Insufficient documentation

## 2015-10-04 DIAGNOSIS — G893 Neoplasm related pain (acute) (chronic): Secondary | ICD-10-CM | POA: Insufficient documentation

## 2015-10-04 LAB — GLUCOSE, CAPILLARY
GLUCOSE-CAPILLARY: 146 mg/dL — AB (ref 65–99)
GLUCOSE-CAPILLARY: 194 mg/dL — AB (ref 65–99)

## 2015-10-04 MED ORDER — MORPHINE SULFATE (PF) 4 MG/ML IV SOLN
4.0000 mg | Freq: Once | INTRAVENOUS | Status: AC
  Administered 2015-10-04: 4 mg via INTRAVENOUS
  Filled 2015-10-04: qty 1

## 2015-10-04 MED ORDER — BOOST PLUS PO LIQD: 237.0000 mL | Freq: Three times a day (TID) | ORAL | Status: AC

## 2015-10-04 MED ORDER — DEXAMETHASONE 2 MG PO TABS: ORAL_TABLET | ORAL | Status: AC

## 2015-10-04 MED ORDER — MORPHINE BOLUS VIA INFUSION: 4.0000 mg | Freq: Once | INTRAVENOUS | Status: DC

## 2015-10-04 MED ORDER — DEXAMETHASONE 4 MG PO TABS
ORAL_TABLET | ORAL | Status: DC
Start: 2015-10-04 — End: 2015-10-04

## 2015-10-04 MED ORDER — MORPHINE SULFATE 1 MG/ML IV SOLN: INTRAVENOUS | Status: AC

## 2015-10-04 NOTE — Discharge Instructions (Signed)
You were hospitalized for shortness of breath and found to have a pneumothorax. This improved with a chest tube which was removed prior to discharge.

## 2015-10-04 NOTE — Progress Notes (Signed)
Pt sent with [facility] transport. IV removed, advanced home care equipment sent with patient. 4 ml of morphine from pca wasted, Merlene Laughter as second Therapist, sports to verify.

## 2015-10-04 NOTE — Progress Notes (Signed)
Glen Mendez [facility]-GIP RN Visit  This is a related admission to [facility] diagnosis of Lung CA.  Patient seen in room with wife, [name] at bedside.  Patient just had chest tube removed and is currently in pain.  He is currently on a Morphine PCA with basal rate at '2mg'$ /hr. Patient is on 3L  with O2 at 93% and respirations at 20. Plan is to discharge home today per patient and wife. They deny any additional DME needs.    Plan is for patient to D/C with Morphine PCA.  Rx has been faxed to [facility].  Home PCA and medication to be delivered prior to discharge from hospital.  Family requesting that patient to be transported home via [place].  CMRN, [name] made aware.  Please call with any hospice related questions.  Thank you, Freddi Starr RN, [facility] Liaison [phone]

## 2015-10-04 NOTE — Discharge Summary (Signed)
[facility] Discharge Summary  Patient name: Glen Mendez Medical record number: [id number] Date of birth: [birth year] Age: 49 y.o. Gender: male Date of Admission: 10/01/2015  Date of Discharge: 10/04/15 Admitting Physician: Alveda Reasons, MD  Primary Care Provider: Leamon Arnt, MD Consultants: Surgery, Palliative Care   Indication for Hospitalization:   Discharge Diagnoses/Problem List:  Respiratory Distress Secondary to Left-sided Pneumothorax Small Cell Carcinoma lung, stage IV with brain metastasis Macrocytic Anemia Sacral Decubitus Ulcers  Bipolar Disorder Protein Calorie Malnutrition  Disposition: home with hospice   Discharge Condition: stable/improved  Discharge Exam:  Temp: [97.6 F (36.4 C)-98.5 F (36.9 C)] 97.6 F (36.4 C) (11/29 0308) Pulse Rate: [115-132] 124 (11/29 0600) Resp: [12-25] 13 (11/29 0600) BP: (109-123)/(69-84) 113/72 mmHg (11/29 0600) SpO2: [3 %-100 %] 98 % (11/29 0600) Physical Exam: General -- oriented x3, pleasant Chest -- On 4L [place]. Lungs extremely rhonchorous bilatearally.  Cardiac -- tachycardia, regular rhythm, difficult to auscultate due to lung sounds  CNS -- A/Ox3, moves all 4 extremities.  Brief Hospital Course:  49 year old male with past medical history significant for stage IV lung cancer with metastasis to his brain, hypertension, bipolar type I disorder, type 2 diabetes mellitus, thrombocytopenia who presents to the hospital with respiratory distress. Patient was found to have a pneumothorax of the left lung which required chest tube placement. Repeat CXR showed some improvement of peumothorax, and patient had chest tube removed on day of discharge. Patient was also placed on supplemental oxygen and Duonebs every 6 hrs scheduled. Patient was also started on Dilaudid PCA for pain and dyspnea. Patient was breathing comfortably with this regimen. Palliative care was consulted and patient chose to go  home with hospice care. Patient was discharged home with PCA pump with the following regimen: '3mg'$  /hr basal, '2mg'$  bolus with 15 min lockout.   During hospital course, patient's hemoglobin was noted to be 6.2. Received 1 unit pRBS with post-transfusion hemoglobin ion 7.6. Labs were not done daily afterwards as patient chose to be comfort care.   Patient's other chronic problems were managed with home medications.   Issues for Follow Up:  - ensure patient's pain is controlled on PCA and patient is breathing comfortably   Significant Procedures: chest tube placement; chest tube removal 11/29  Significant Labs and Imaging:   Recent Labs Lab 10/01/15 0836 10/02/15 0350 10/02/15 1413 10/03/15 0225  WBC 16.8* 7.8 7.7  --   HGB 9.9* 6.8* 6.2* 7.6*  HCT 30.6* 21.1* 19.7* 23.1*  PLT 86* 50* 50*  --     Recent Labs Lab 10/01/15 0836 10/02/15 0350  NA 135 137  K 3.1* 3.3*  CL 92* 98*  CO2 32 32  GLUCOSE 173* 183*  BUN 19 16  CREATININE 1.19 0.91  CALCIUM 9.3 8.1*  CXR 11/26:  IMPRESSION: New moderate to large left pneumothorax with mild cardiomediastinal shift to the right.  Stable changes over the right upper lung/ apex in this patient with known history of lung cancer.  Critical Value/emergent results were called by telephone at the time of interpretation on 10/01/2015 at 8:22 am to Dr. Carmin Muskrat , who verbally acknowledged these results.  CXR 11/26:  IMPRESSION: Interval placement of a left chest tube. The left pneumothorax has decreased in size as described.  Chronic changes in the right chest. CXR 11/27:  IMPRESSION: 1. Reduction in volume of LEFT pneumothorax with chest tube in place. 2. Stable apical density in the RIGHT upper lobe. 3. Mild  airspace disease within the LEFT lung. No change.  CXR 11/28: IMPRESSION: 1. Post treatment changes in the right upper lobe with scattered nodularity in the left lung, better evaluated on  cross-sectional imaging 08/18/2015. 2. Small left pneumothorax, stable to minimally increased from 10/02/2015, with left chest tube in place.  CXR 11/29:  IMPRESSION: 1. PowerPort catheter and left chest tube in stable position. Small left pneumothorax, improved from prior exam. 2. Persistent posttreatment changes right upper lobe. Scattered nodularity present in the lungs, best demonstrated by prior CT.  Results/Tests Pending at Time of Discharge: none  Discharge Medications:    Medication List    STOP taking these medications        bisacodyl 10 MG suppository  Commonly known as:  DULCOLAX     levofloxacin 500 MG tablet  Commonly known as:  LEVAQUIN     morphine 20 MG/ML concentrated solution  Commonly known as:  ROXANOL  Replaced by:  morphine 1 MG/ML     oxyCODONE 20 mg 12 hr tablet  Commonly known as:  OXYCONTIN      TAKE these medications        albuterol (5 MG/ML) 0.5% nebulizer solution  Commonly known as:  PROVENTIL  Take 2.5 mg by nebulization every 4 (four) hours as needed for wheezing or shortness of breath.     amitriptyline 25 MG tablet  Commonly known as:  ELAVIL  Take 25 mg by mouth at bedtime.     bacitracin ointment  Apply topically 2 (two) times daily.     clonazePAM 1 MG tablet  Commonly known as:  KLONOPIN  Take 0.5-1 tablets (0.5-1 mg total) by mouth 3 (three) times daily as needed for anxiety.     dexamethasone 2 MG tablet  Commonly known as:  DECADRON  Take 1 tablet once every other day     docusate sodium 100 MG capsule  Commonly known as:  COLACE  Take 2 capsules (200 mg total) by mouth at bedtime.     glipiZIDE 5 MG tablet  Commonly known as:  GLUCOTROL  Take 5 mg by mouth daily.     GuaiFENesin 200 MG/10ML Soln  Take 10 mLs (200 mg total) by mouth every 4 (four) hours. For cough     ipratropium-albuterol 0.5-2.5 (3) MG/3ML Soln  Commonly known as:  DUONEB  Take 3 mLs by nebulization every 6 (six) hours.     lactose free  nutrition Liqd  Take 237 mLs by mouth 3 (three) times daily between meals.     morphine 1 MG/ML  Morphine PCA Basal rate: '2mg'$  per hour Bolus dose: '2mg'$  with 15 minute lockout Max 1 hour dose: '10mg'$   Solution concentration and volume to be dispensed to be calculated by home care pharmacy.     polyethylene glycol packet  Commonly known as:  MIRALAX / GLYCOLAX  Take 17 g by mouth 2 (two) times daily as needed for mild constipation.     QUEtiapine 400 MG tablet  Commonly known as:  SEROQUEL  Take 400 mg by mouth at bedtime.     zolpidem 10 MG tablet  Commonly known as:  AMBIEN  Take 1 tablet (10 mg total) by mouth at bedtime as needed for sleep.        Discharge Instructions: Please refer to Patient Instructions section of EMR for full details.  Patient was counseled important signs and symptoms that should prompt return to medical care, changes in medications, dietary instructions, activity restrictions, and follow up  appointments.   Follow-Up Appointments:   Smiley Houseman, MD 10/04/2015, 12:16 PM PGY-1, [place]

## 2015-10-04 NOTE — Progress Notes (Signed)
Family Medicine Teaching Service Daily Progress Note Intern Pager: [phone]  Patient name: Glen Mendez Medical record number: [id number] Date of birth: [birth year] Age: 49 y.o. Gender: male  Primary Care Provider: Leamon Arnt, MD Consultants: Cardiothoracic, Palliative Code Status: DNR/DNI  Pt Overview and Major Events to Date:  11/26: Patient presented w/ pneumothorax, improved w/ chest tube, admitted for further care.  Assessment and Plan: Glen Mendez is a 49 y.o. male presenting with respiratory distress. 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  Respiratory distress secondary to left-sided pneumothorax: s/p left chest tube placement. Repeat chest x-ray with improved but not fully resolved pneumothorax. Continues to have tachypnea w/o hypoxia and tachycardia. In no acute distress. Pneumothorax is in the setting of recent PNA admission and current Stage 4 Small Cell Carcinoma of the Lung w/ metastatic lesions to brain, lung, and more.  - Oxygen supplementation - Chest tube placement 11/26 to water seal overnight; Cardiothoracic consulted, appreciate the recs: Per nursing, surgery recommended removal of chest tube today - CXR 11/29: Small left pneumothorax, improved from prior exam. - Duoneb q 6 hrs scheduled - Morphine PCA: dose increased to 72m basal; continue 270mbolus with 15 min lockout  - care management consulted to ensure patient gets PCA pump at discharge  Small cell carcinoma, lung, stage IV, brain metastasis: Patient recently placed on hospice care. During admission it was apparent that patient and family have not bought into this status, as patient appears to want care above the level of comfort. Also, family has been contacting independent cancer centers for second opinions [facility]. - Monitor respiratory status - Spiritual care consultation placed - Pain control: Morphine (sublingual) every 4 hours PRN, OxyContin 20 mg twice a day - Increase as necessary - Decadron 2 mg daily (patient is currently on a slow taper from oncologist orders; patient is to take 2 mg every other day beginning Dec 1, with a final dose of December 8)  - Palliative care following, appreciate reccs:   - His goal is to seek therapies likely to help him feel better and get back home. He would like to try to get home as soon as tomorrow.   - home with PCA  - Would not plan to continue abx long term.   SIRS/Sepsis: Met criteria with tachypnea, tachycardia, and leukocytosis of 16.8 on admission. Tachypnea/tachycardia can be explained by pneumothorax. However, leukocytosis appears to be new without known cause at this time, and tachypnea/tachycardia persists after chest tube placement. Pulmonary source difficult to assess w/ pneumothorax present on x-ray and current exam findings. Patient denies any dysuria.  Continues to have tachycardia although overall improved and Tachypnea is also improving.  - UA unremarkable - Wound care for ulcers >> seem to be improving since last checked during previous admission. - WBC 7.8 (11/27)  - Vanc/Zosyn per (discontinued 11/27) - Blood cultures: pending - Urine culture: pending - Watch for fever and/or HoTN >> provided 2L bolus overnight for soft BP  Anemia, macrocytic: 6.8 on 11/27. Likely a combination of chronic disease and acute. MCV 105.0. Likely a factor in patient's tachycardia/tachypnea. - Folate and Vit B12 QD  - s/p 1 unit pRBC 11/27 > hgb 7.6 - no longer having labs drawn as patient is comfort care   Thrombocytopenia: On admission Platelets 86;  has been as low as 37 on 09/13/15. S/p 4 cycles of chemotherapy and radiation therapy -- which could have caused this suppression. Another cause would include suppression from  chronic disease. Today (11/27) platelets 50 - Will continue to monitor  Sacral decubitus ulcers: Seen on bilateral buttocks. - Will continue to monitor  - Wound care consulted >> Ulcers improved from when last seen during previous admission   Hypokalemia: On admission K+ 3.1. Currently 3.3 - Replenish as needed - 40 mEq twice a day 3 doses - no longer having labs drawn as patient is comfort care   Type 2 diabetes: - No medications listed for this.  - A1c pending - CGB 173 on admission - SSI sensitive  Bipolar 1 disorder: - Home Quetiapine 40 mg daily  - Home Amitriptyline 25 mg daily   Protein calorie malnutrition: - Nutrition: Boots Plus PO TID; appetite stimulant per MD if able (wife concerned pt is eating poorly)  FEN/GI: D5/NS 100 mL/hour; heart healthy/carb modified diet  Prophylaxis: none, comfort care   Disposition: Home with home hospice possibly 11/29 if CXR shows improvement of pneumothorax per surgery   Subjective:  - no concerns today - breathing has been stable - pain is controlled with PCA  - eager to go home   Objective: Temp:  [97.6 F (36.4 C)-98.5 F (36.9 C)] 97.6 F (36.4 C) (11/29 0308) Pulse Rate:  [115-132] 124 (11/29 0600) Resp:  [12-25] 13 (11/29 0600) BP: (109-123)/(69-84) 113/72 mmHg (11/29 0600) SpO2:  [3 %-100 %] 98 % (11/29 0600) Physical Exam: General -- oriented x3, pleasant Chest -- On 4L [place]. Lungs extremely rhonchorous bilatearally. Chest tube and dressing in place (L-side) Cardiac -- tachycardia, regular rhythm, difficult to auscultate due to lung sounds  CNS -- A/Ox3, moves all 4 extremities.  Laboratory:  Recent Labs Lab 10/01/15 0836 10/02/15 0350 10/02/15 1413 10/03/15 0225  WBC 16.8* 7.8 7.7  --   HGB 9.9* 6.8* 6.2* 7.6*  HCT 30.6* 21.1* 19.7* 23.1*  PLT 86* 50* 50*  --     Recent Labs Lab 10/01/15 0836 10/02/15 0350  NA 135 137  K 3.1* 3.3*  CL 92* 98*  CO2 32 32  BUN 19 16  CREATININE 1.19 0.91   CALCIUM 9.3 8.1*  GLUCOSE 173* 183*   Urine Cx: pending Blood Cx: pending  Imaging/Diagnostic Tests: CXR 11/26 IMPRESSION #1: New moderate to large left pneumothorax with mild cardiomediastinal shift to the right. Stable changes over the right upper lung/ apex in this patient with known history of lung cancer. IMPRESSION #2: Interval placement of a left chest tube. The left pneumothorax has decreased in size as described. Chronic changes in the right chest.  CXR 11/29: IMPRESSION: 1. PowerPort catheter and left chest tube in stable position. Small left pneumothorax, improved from prior exam. 2. Persistent posttreatment changes right upper lobe. Scattered nodularity present in the lungs, best demonstrated by prior CT.  Smiley Houseman, MD 10/04/2015, 6:49 AM PGY-1, [place] Intern pager: [phone], text pages welcome

## 2015-10-04 NOTE — Clinical Social Work Note (Signed)
Patient discharged home today and was transported by ambulance.  Beatric Fulop Givens, MSW, LCSW Licensed Clinical Social Worker [place] [phone]
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
## 2015-10-04 NOTE — Progress Notes (Addendum)
Subjective: Says he feels pretty comfortable.  Objective: Vital signs in last 24 hours: Temp:  [97.6 F (36.4 C)-98.5 F (36.9 C)] 97.6 F (36.4 C) (11/29 0308) Pulse Rate:  [115-132] 124 (11/29 0600) Cardiac Rhythm:  [-] Sinus tachycardia (11/29 0308) Resp:  [12-25] 13 (11/29 0600) BP: (109-123)/(69-84) 113/72 mmHg (11/29 0600) SpO2:  [3 %-100 %] 98 % (11/29 0600)  Hemodynamic parameters for last 24 hours:    Intake/Output from previous day: 11/28 0701 - 11/29 0700 In: 2880 [P.O.:480; I.V.:2400] Out: 1510 [Urine:1500; Chest Tube:10] Intake/Output this shift:    General appearance: alert, cooperative and no distress Heart: regular rate and rhythm and tachy Lungs: coarse BS  Lab Results:  Recent Labs  10/02/15 0350 10/02/15 1413 10/03/15 0225  WBC 7.8 7.7  --   HGB 6.8* 6.2* 7.6*  HCT 21.1* 19.7* 23.1*  PLT 50* 50*  --    BMET:  Recent Labs  10/01/15 0836 10/02/15 0350  NA 135 137  K 3.1* 3.3*  CL 92* 98*  CO2 32 32  GLUCOSE 173* 183*  BUN 19 16  CREATININE 1.19 0.91  CALCIUM 9.3 8.1*    PT/INR: No results for input(s): LABPROT, INR in the last 72 hours. ABG    Component Value Date/Time   HCO3 25.0* 09/11/2015 0250   TCO2 24.0 09/11/2015 0250   ACIDBASEDEF 0.1 09/11/2015 0250   O2SAT 70.7 09/11/2015 0250   CBG (last 3)   Recent Labs  10/03/15 1550 10/03/15 1724 10/03/15 2114  GLUCAP 193* 150* 144*    Meds Scheduled Meds: . amitriptyline  25 mg Oral QHS  . dexamethasone  2 mg Oral Daily  . docusate sodium  200 mg Oral QHS  . folic acid  1 mg Oral Daily  . insulin aspart  0-9 Units Subcutaneous TID WC  . ipratropium-albuterol  3 mL Nebulization Q6H  . lactose free nutrition  237 mL Oral TID BM  . morphine   Intravenous 6 times per day  . polyethylene glycol  17 g Oral Daily  . QUEtiapine  400 mg Oral QHS  . senna  1 tablet Oral BID  . thiamine  100 mg Oral Daily   Continuous Infusions: . dextrose 5 % and 0.9% NaCl 100 mL/hr  at 10/03/15 2143   PRN Meds:.acetaminophen **OR** acetaminophen, albuterol, bisacodyl, clonazePAM, diphenhydrAMINE **OR** diphenhydrAMINE, morphine injection, naloxone **AND** sodium chloride, ondansetron (ZOFRAN) IV, ondansetron, polyethylene glycol  Xrays Dg Chest Port 1 View  10/03/2015  CLINICAL DATA:  Left pneumothorax. EXAM: PORTABLE CHEST 1 VIEW COMPARISON:  10/02/2015 and CT chest 08/18/2015. FINDINGS: Trachea is deviated slightly to the right. Right IJ power port tip is in the right atrium. Heart size stable. Right upper lobe collapse/consolidation with areas of internal lucency, unchanged and better evaluated on cross-sectional imaging 08/18/2015. Left chest tube is in place with a small left pneumothorax, stable to very minimally increased from 10/02/2015. Nodularity throughout the left hemi thorax, as before. No definite pleural fluid. Subcutaneous air along the lower left chest wall. IMPRESSION: 1. Post treatment changes in the right upper lobe with scattered nodularity in the left lung, better evaluated on cross-sectional imaging 08/18/2015. 2. Small left pneumothorax, stable to minimally increased from 10/02/2015, with left chest tube in place. Electronically Signed   By: Glen Mendez M.D.   On: 10/03/2015 07:28   Dg Chest Port 1 View  10/02/2015  CLINICAL DATA:  LEFT pneumothorax EXAM: PORTABLE CHEST 1 VIEW COMPARISON:  10/01/2015 FINDINGS: RIGHT power port unchanged. Stable  cardiac silhouette. There is opacification of the RIGHT lung apex similar to comparison exam. No discrete pneumothorax. There is patchy airspace opacity in the LEFT lung. LEFT chest tube in place. There is interval expansion of the LEFT lung with decreased pneumothorax. Trace lateral pneumothorax the LEFT. The apical pneumothorax on the LEFT measures 13 mm from the apical chest wall and decreased from 25 mm. IMPRESSION: 1. Reduction in volume of LEFT pneumothorax with chest tube in place. 2. Stable apical density in  the RIGHT upper lobe. 3. Mild airspace disease within the LEFT lung.  No change. Electronically Signed   By: Glen Mendez M.D.   On: 10/02/2015 10:13    Assessment/Plan:  1 No air leak and Left pntx is stable or slightly improved. Could probably d/c to hospice with [facility] or possibly chance tube removal. MD to see     LOS: 3 days    Glen Mendez,Glen Mendez 10/04/2015  Patient seen and xray reviewed, no airleak x 24 hours  D/c chest tube today Home with hospice per medical service when ready I have seen and examined Glen Mendez and agree with the above assessment  and plan.  Glen Isaac MD Beeper [phone] Office [phone] 10/04/2015 9:32 AM

## 2015-10-05 ENCOUNTER — Telehealth: Payer: Self-pay | Admitting: *Deleted

## 2015-10-05 NOTE — Telephone Encounter (Signed)
Call from [place], [place] with Hospice states "the pt's wife is calling and asking for liquid ativan because the pt is yelling, agitated. If ativan is not ok maybe haldol for pt" Will review with MD and call back with further instructions.

## 2015-10-06 LAB — CULTURE, BLOOD (ROUTINE X 2)
CULTURE: NO GROWTH
Culture: NO GROWTH

## 2015-10-06 MED ORDER — LORAZEPAM 2 MG/ML PO CONC: ORAL | Status: AC

## 2015-10-06 NOTE — Telephone Encounter (Signed)
Review with MD, Rx for Ativan suspension faxed to [facility].

## 2015-10-06 NOTE — Addendum Note (Signed)
Addended by: Lucile Crater on: 10/06/2015 03:03 PM   Modules accepted: Orders, Medications

## 2015-10-10 ENCOUNTER — Telehealth: Payer: Self-pay | Admitting: Medical Oncology

## 2015-10-12 ENCOUNTER — Telehealth: Payer: Self-pay | Admitting: Internal Medicine

## 2015-10-12 NOTE — Telephone Encounter (Signed)
Received death certificate 11/0/03

## 2015-11-06 NOTE — Telephone Encounter (Signed)
Pt died today . 

## 2015-11-06 DEATH — deceased

## 2016-01-22 IMAGING — CT CT CHEST W/ CM
1 of 3 series · 13 of 30 positions shown, 17 images · IV contrast (OMNIPAQUE)
Comparison: None.

CLINICAL DATA: Restaging small cell right lung cancer, with brain
metastases. Diagnosed in November 2014, XRT complete August 2015.
Intermittent shortness of breath. Lower extremity peripheral edema.

EXAM:
CT CHEST, ABDOMEN, AND PELVIS WITH CONTRAST
TECHNIQUE: Multidetector CT imaging of the chest, abdomen and pelvis was
performed following the standard protocol during bolus
administration of intravenous contrast.
CONTRAST:  100 mL Omnipaque 300 IV

[Series 2: cap with st · axial · 0.89mm/px · z∈[+655,+1235]mm · 13 of 134 slices shown, 17 images]
[im 9/134  mediastinal]
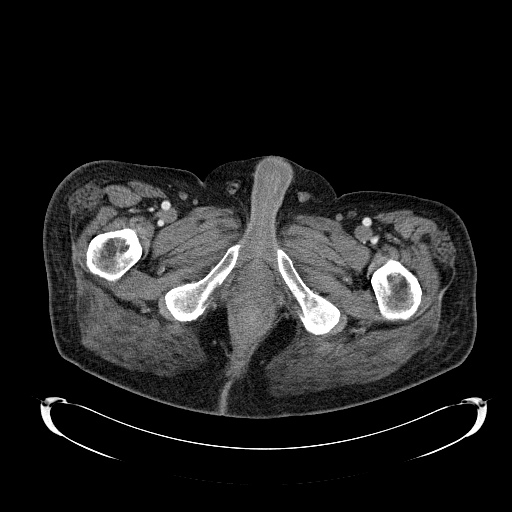
[im 9/134  lung]
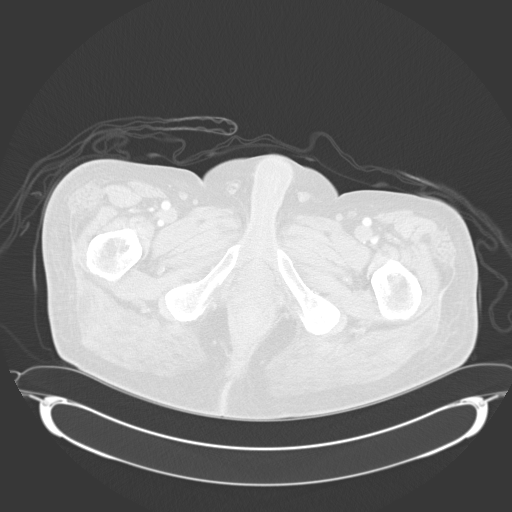
[im 17/134  lung]
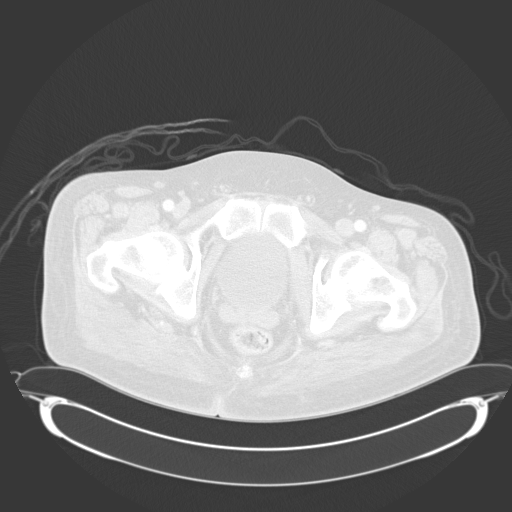
[im 34/134  lung]
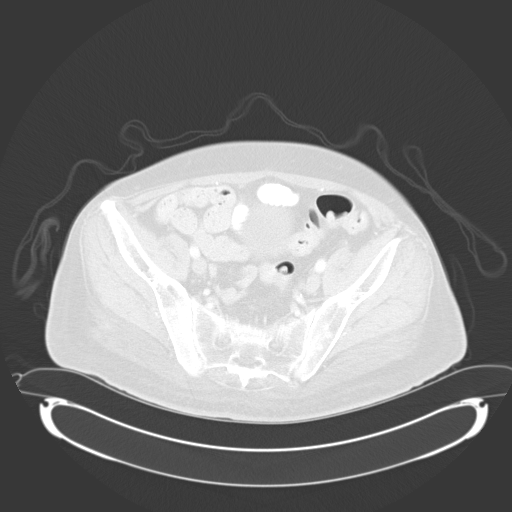
[im 42/134  lung]
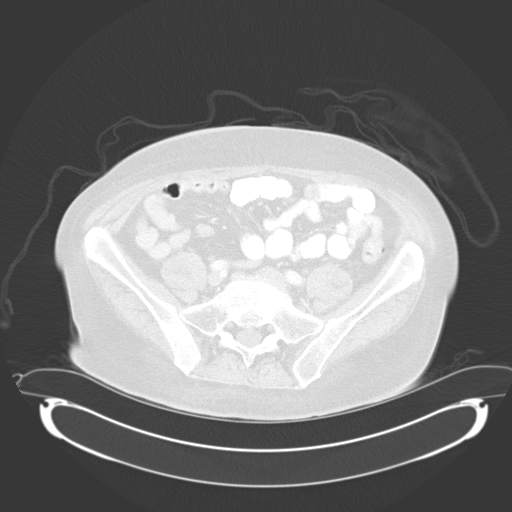
[im 50/134  mediastinal]
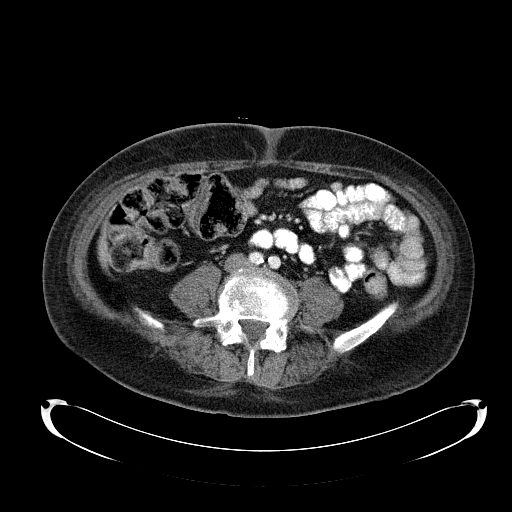
[im 50/134  lung]
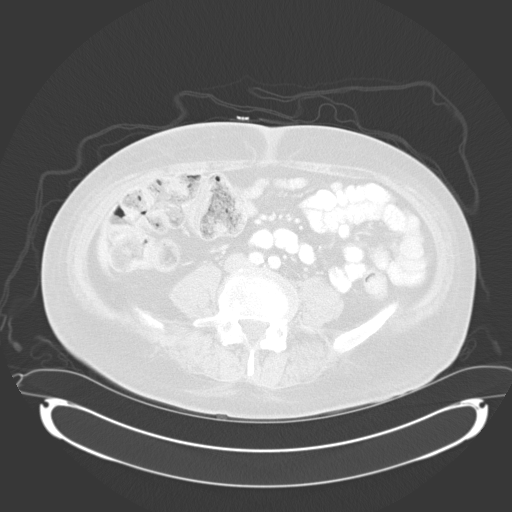
[im 59/134  lung]
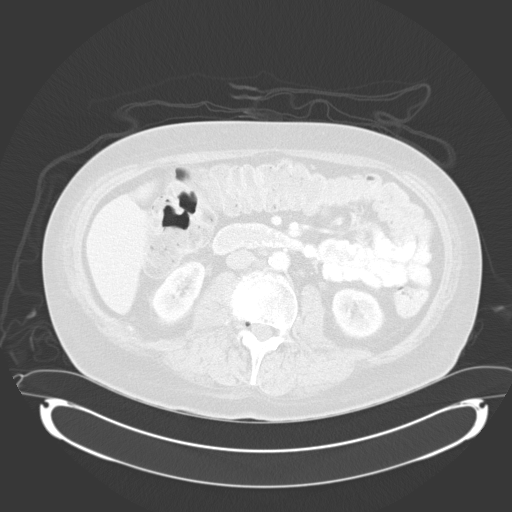
[im 67/134  lung]
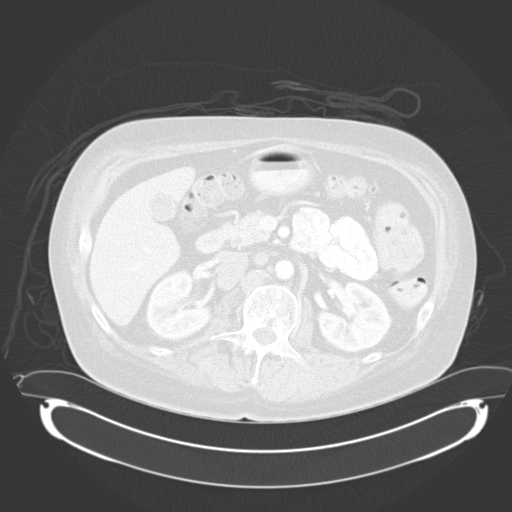
[im 75/134  lung]
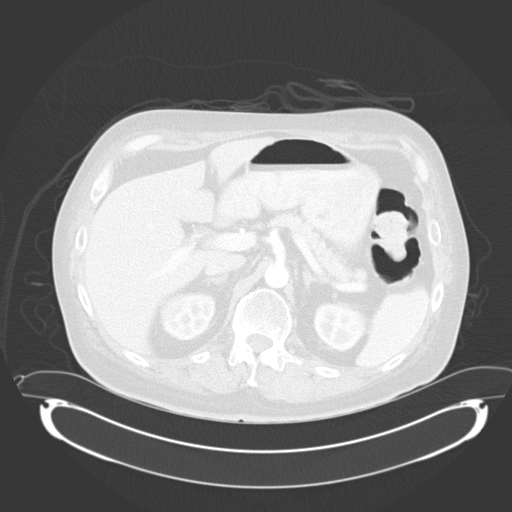
[im 84/134  mediastinal]
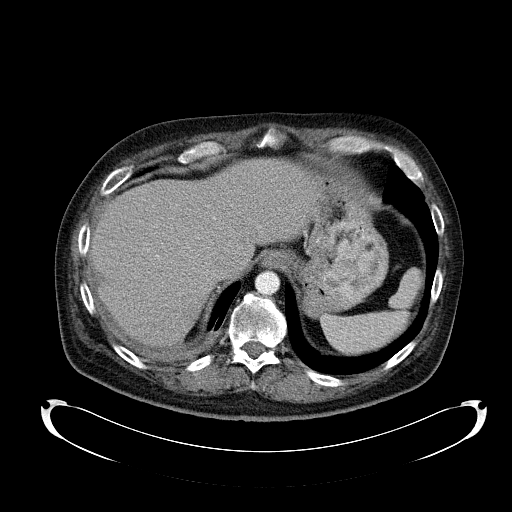
[im 84/134  lung]
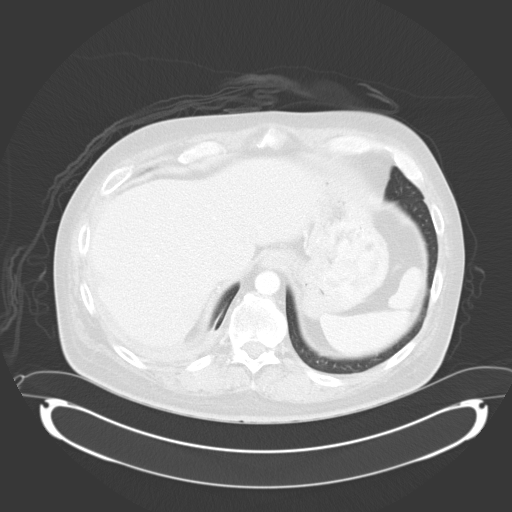
[im 92/134  lung]
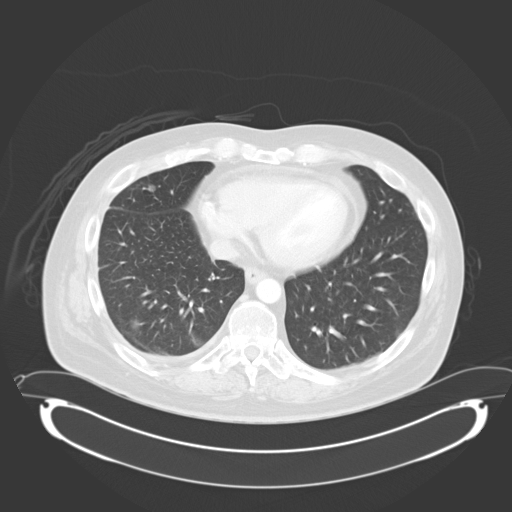
[im 100/134  lung]
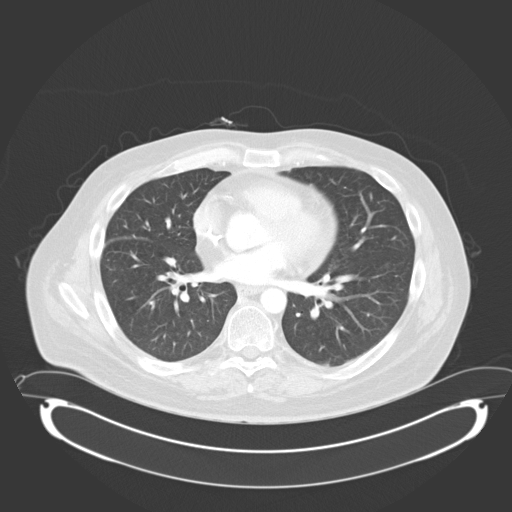
[im 117/134  lung]
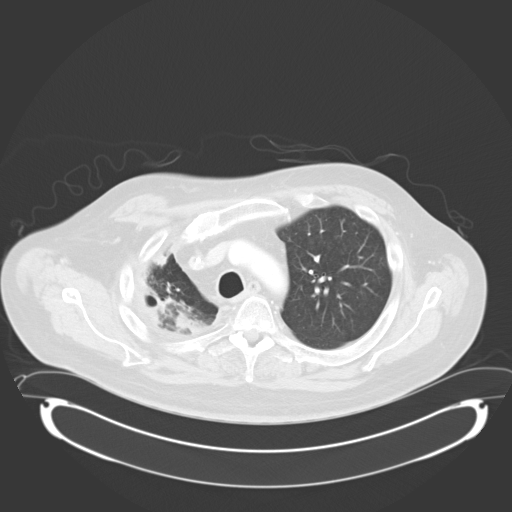
[im 125/134  mediastinal]
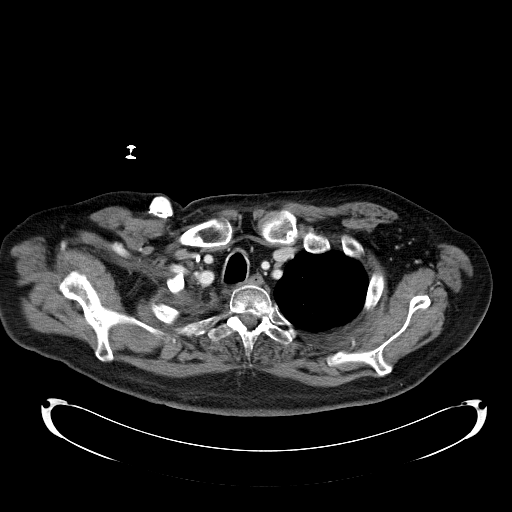
[im 125/134  lung]
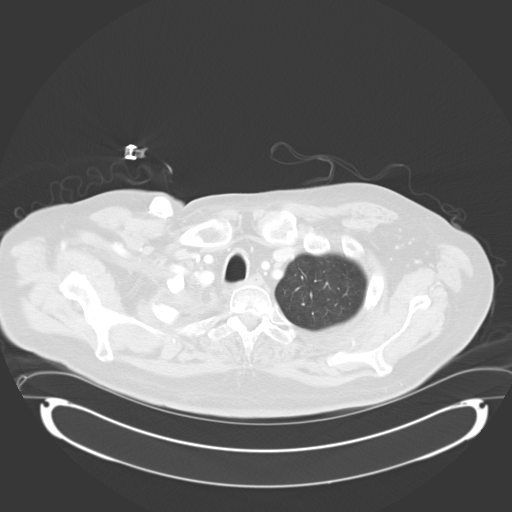

[13 of 30 positions shown; findings below may reference images not displayed]

FINDINGS: CT CHEST FINDINGS

Mediastinum/Nodes: The heart is normal in size. Trace pericardial
effusion.

Right chest port terminates at the cavoatrial junction.

No suspicious mediastinal, hilar, or axillary lymphadenopathy.

Visualized thyroid is unremarkable.

Lungs/Pleura: Evaluation is constrained by lack of preprocedural
imaging for comparison.

Suspected radiation changes in the right upper lobe with associated
volume loss and bronchiectasis. 3.1 x 1.8 cm cavitary lesion with
fluid is favored to reflect a dilated bronchus.

However, there is an underlying 1.3 x 2.6 cm spiculated/nodular
opacity in the right lung apex (series 5/image 16) which is
worrisome for residual tumor.

Additionally, there are bilateral pulmonary nodules which are
suspicious for metastases, including:

1.2 x 1.5 cm irregular left upper lobe nodule (series 5/image 20)

2.0 x 2.0 cm irregular left lower lobe nodule (series 5/ image 30)

1.3 x 1.7 cm nodule at the posterior right lung base (series 5/
image 45)

Trace fluid/pleural thickening along the lateral right upper
hemithorax. Small right pleural effusion.

No pneumothorax.

Musculoskeletal: Mild degenerative changes of the thoracic spine.

CT ABDOMEN PELVIS FINDINGS

Hepatobiliary: Liver is within normal limits. No
suspicious/enhancing hepatic lesions.

Gallbladder is unremarkable. No intrahepatic or extrahepatic ductal
dilatation.

Pancreas: Within normal limits.

Spleen: Within normal limits.

Adrenals/Urinary Tract: Adrenal glands are within normal limits.

3.4 x 2.9 cm hypoenhancing mass in the lateral right upper kidney
(series 2/image 64), worrisome for metastasis, less likely solid
renal neoplasm. Three 1-2 mm nonobstructing calculi in the right
lower pole (series 2/ images 71 and 73). No hydronephrosis.

Left kidney is within normal limits.

Bladder is within normal limits.

Stomach/Bowel: Stomach is within normal limits.

No evidence of bowel obstruction.

Normal appendix.

Vascular/Lymphatic: Mild atherosclerotic calcifications of the
abdominal aorta and branch vessels.

No suspicious abdominopelvic lymphadenopathy.

Reproductive: Prostate is unremarkable.

Other: No abdominopelvic ascites.

Musculoskeletal: Degenerative changes of the lumbar spine.
IMPRESSION: Evaluation is constrained by lack of preprocedural imaging for
comparison. The current study will serve as the post treatment
baseline.

Suspected radiation changes with volume loss in the right upper
lobe. Associated small right pleural effusion.

Underlying 1.3 x 2.6 cm spiculated/nodular opacity in the right lung
apex is worrisome for residual tumor.

Additional bilateral pulmonary nodules, measuring up to 2.0 cm in
the left lower lobe, suspicious for metastases.

3.4 cm mass in the lateral right upper kidney, worrisome for
metastasis, less likely solid renal neoplasm.

## 2016-02-01 NOTE — Telephone Encounter (Signed)
error
